# Patient Record
Sex: Male | Born: 1970
Health system: Southern US, Community
[De-identification: ages and names within clinical notes are randomized; demographics above are authoritative.]

## PROBLEM LIST (undated history)

## (undated) DIAGNOSIS — T7840XA Allergy, unspecified, initial encounter: Secondary | ICD-10-CM

## (undated) DIAGNOSIS — J45909 Unspecified asthma, uncomplicated: Secondary | ICD-10-CM

## (undated) HISTORY — DX: Allergy, unspecified, initial encounter: T78.40XA

## (undated) HISTORY — DX: Unspecified asthma, uncomplicated: J45.909

---

## 1997-06-18 ENCOUNTER — Emergency Department (HOSPITAL_COMMUNITY): Admission: EM | Admit: 1997-06-18 | Discharge: 1997-06-18 | Payer: Self-pay | Admitting: Emergency Medicine

## 1998-05-21 ENCOUNTER — Emergency Department (HOSPITAL_COMMUNITY): Admission: EM | Admit: 1998-05-21 | Discharge: 1998-05-21 | Payer: Self-pay | Admitting: Emergency Medicine

## 1999-11-02 ENCOUNTER — Emergency Department (HOSPITAL_COMMUNITY): Admission: EM | Admit: 1999-11-02 | Discharge: 1999-11-02 | Payer: Self-pay | Admitting: *Deleted

## 2004-02-26 ENCOUNTER — Emergency Department (HOSPITAL_COMMUNITY): Admission: EM | Admit: 2004-02-26 | Discharge: 2004-02-27 | Payer: Self-pay | Admitting: Internal Medicine

## 2004-06-03 ENCOUNTER — Emergency Department (HOSPITAL_COMMUNITY): Admission: EM | Admit: 2004-06-03 | Discharge: 2004-06-03 | Payer: Self-pay | Admitting: Emergency Medicine

## 2005-03-23 ENCOUNTER — Emergency Department (HOSPITAL_COMMUNITY): Admission: EM | Admit: 2005-03-23 | Discharge: 2005-03-24 | Payer: Self-pay | Admitting: Emergency Medicine

## 2008-08-31 ENCOUNTER — Emergency Department (HOSPITAL_COMMUNITY): Admission: EM | Admit: 2008-08-31 | Discharge: 2008-08-31 | Payer: Self-pay | Admitting: Emergency Medicine

## 2013-06-19 ENCOUNTER — Ambulatory Visit (INDEPENDENT_AMBULATORY_CARE_PROVIDER_SITE_OTHER): Payer: No Typology Code available for payment source | Admitting: Family Medicine

## 2013-06-19 VITALS — BP 122/68 | HR 67 | Temp 98.6°F | Resp 16 | Ht 67.5 in | Wt 175.4 lb

## 2013-06-19 DIAGNOSIS — J309 Allergic rhinitis, unspecified: Secondary | ICD-10-CM

## 2013-06-19 DIAGNOSIS — J45909 Unspecified asthma, uncomplicated: Secondary | ICD-10-CM | POA: Insufficient documentation

## 2013-06-19 MED ORDER — ALBUTEROL SULFATE HFA 108 (90 BASE) MCG/ACT IN AERS: 1.0000 | INHALATION_SPRAY | RESPIRATORY_TRACT | Status: DC | PRN

## 2013-06-19 MED ORDER — FLUTICASONE PROPIONATE 50 MCG/ACT NA SUSP: 2.0000 | Freq: Every day | NASAL | Status: AC

## 2013-06-19 MED ORDER — BECLOMETHASONE DIPROPIONATE 40 MCG/ACT IN AERS: 1.0000 | INHALATION_SPRAY | Freq: Two times a day (BID) | RESPIRATORY_TRACT | Status: DC

## 2013-06-19 NOTE — Patient Instructions (Signed)
Start Qvar, albuterol if needed, and if still having frequent symptoms on this regimen - return to look at other asthma treatments.   Continue allegra each day, start flonase nasal spray during allergy season.   Asthma, Adult Asthma is a recurring condition in which the airways tighten and narrow. Asthma can make it difficult to breathe. It can cause coughing, wheezing, and shortness of breath. Asthma episodes (also called asthma attacks) range from minor to life-threatening. Asthma cannot be cured, but medicines and lifestyle changes can help control it. CAUSES Asthma is believed to be caused by inherited (genetic) and environmental factors, but its exact cause is unknown. Asthma may be triggered by allergens, lung infections, or irritants in the air. Asthma triggers are different for each person. Common triggers include:   Animal dander.  Dust mites.  Cockroaches.  Pollen from trees or grass.  Mold.  Smoke.  Air pollutants such as dust, household cleaners, hair sprays, aerosol sprays, paint fumes, strong chemicals, or strong odors.  Cold air, weather changes, and winds (which increase molds and pollens in the air).  Strong emotional expressions such as crying or laughing hard.  Stress.  Certain medicines (such as aspirin) or types of drugs (such as beta-blockers).  Sulfites in foods and drinks. Foods and drinks that may contain sulfites include dried fruit, potato chips, and sparkling grape juice.  Infections or inflammatory conditions such as the flu, a cold, or an inflammation of the nasal membranes (rhinitis).  Gastroesophageal reflux disease (GERD).  Exercise or strenuous activity. SYMPTOMS Symptoms may occur immediately after asthma is triggered or many hours later. Symptoms include:  Wheezing.  Excessive nighttime or early morning coughing.  Frequent or severe coughing with a common cold.  Chest tightness.  Shortness of breath. DIAGNOSIS  The diagnosis of  asthma is made by a review of your medical history and a physical exam. Tests may also be performed. These may include:  Lung function studies. These tests show how much air you breath in and out.  Allergy tests.  Imaging tests such as X-rays. TREATMENT  Asthma cannot be cured, but it can usually be controlled. Treatment involves identifying and avoiding your asthma triggers. It also involves medicines. There are 2 classes of medicine used for asthma treatment:   Controller medicines. These prevent asthma symptoms from occurring. They are usually taken every day.  Reliever or rescue medicines. These quickly relieve asthma symptoms. They are used as needed and provide short-term relief. Your health care provider will help you create an asthma action plan. An asthma action plan is a written plan for managing and treating your asthma attacks. It includes a list of your asthma triggers and how they may be avoided. It also includes information on when medicines should be taken and when their dosage should be changed. An action plan may also involve the use of a device called a peak flow meter. A peak flow meter measures how well the lungs are working. It helps you monitor your condition. HOME CARE INSTRUCTIONS   Take medicine as directed by your health care provider. Speak with your health care provider if you have questions about how or when to take the medicines.  Use a peak flow meter as directed by your health care provider. Record and keep track of readings.  Understand and use the action plan to help minimize or stop an asthma attack without needing to seek medical care.  Control your home environment in the following ways to help prevent asthma attacks:  Do not smoke. Avoid being exposed to secondhand smoke.  Change your heating and air conditioning filter regularly.  Limit your use of fireplaces and wood stoves.  Get rid of pests (such as roaches and mice) and their  droppings.  Throw away plants if you see mold on them.  Clean your floors and dust regularly. Use unscented cleaning products.  Try to have someone else vacuum for you regularly. Stay out of rooms while they are being vacuumed and for a short while afterward. If you vacuum, use a dust mask from a hardware store, a double-layered or microfilter vacuum cleaner bag, or a vacuum cleaner with a HEPA filter.  Replace carpet with wood, tile, or vinyl flooring. Carpet can trap dander and dust.  Use allergy-proof pillows, mattress covers, and box spring covers.  Wash bed sheets and blankets every week in hot water and dry them in a dryer.  Use blankets that are made of polyester or cotton.  Clean bathrooms and kitchens with bleach. If possible, have someone repaint the walls in these rooms with mold-resistant paint. Keep out of the rooms that are being cleaned and painted.  Wash hands frequently. SEEK MEDICAL CARE IF:   You have wheezing, shortness of breath, or a cough even if taking medicine to prevent attacks.  The colored mucus you cough up (sputum) is thicker than usual.  Your sputum changes from clear or white to yellow, green, gray, or bloody.  You have any problems that may be related to the medicines you are taking (such as a rash, itching, swelling, or trouble breathing).  You are using a reliever medicine more than 2 3 times per week.  Your peak flow is still at 50 79% of you personal best after following your action plan for 1 hour. SEEK IMMEDIATE MEDICAL CARE IF:   You seem to be getting worse and are unresponsive to treatment during an asthma attack.  You are short of breath even at rest.  You get short of breath when doing very little physical activity.  You have difficulty eating, drinking, or talking due to asthma symptoms.  You develop chest pain.  You develop a fast heartbeat.  You have a bluish color to your lips or fingernails.  You are lightheaded, dizzy,  or faint.  Your peak flow is less than 50% of your personal best.  You have a fever or persistent symptoms for more than 2 3 days.  You have a fever and symptoms suddenly get worse. MAKE SURE YOU:   Understand these instructions.  Will watch your condition.  Will get help right away if you are not doing well or get worse. Document Released: 02/01/2005 Document Revised: 10/04/2012 Document Reviewed: 08/31/2012 [facility] Patient Information 2014 [place], [place].    Allergic Rhinitis Allergic rhinitis is when the mucous membranes in the nose respond to allergens. Allergens are particles in the air that cause your body to have an allergic reaction. This causes you to release allergic antibodies. Through a chain of events, these eventually cause you to release histamine into the blood stream. Although meant to protect the body, it is this release of histamine that causes your discomfort, such as frequent sneezing, congestion, and an itchy, runny nose.  CAUSES  Seasonal allergic rhinitis (hay fever) is caused by pollen allergens that may come from grasses, trees, and weeds. Year-round allergic rhinitis (perennial allergic rhinitis) is caused by allergens such as house dust mites, pet dander, and mold spores.  SYMPTOMS   Nasal stuffiness (  congestion).  Itchy, runny nose with sneezing and tearing of the eyes. DIAGNOSIS  Your health care provider can help you determine the allergen or allergens that trigger your symptoms. If you and your health care provider are unable to determine the allergen, skin or blood testing may be used. TREATMENT  Allergic Rhinitis does not have a cure, but it can be controlled by:  Medicines and allergy shots (immunotherapy).  Avoiding the allergen. Hay fever may often be treated with antihistamines in pill or nasal spray forms. Antihistamines block the effects of histamine. There are over-the-counter medicines that may help with nasal congestion and swelling  around the eyes. Check with your health care provider before taking or giving this medicine.  If avoiding the allergen or the medicine prescribed do not work, there are many new medicines your health care provider can prescribe. Stronger medicine may be used if initial measures are ineffective. Desensitizing injections can be used if medicine and avoidance does not work. Desensitization is when a patient is given ongoing shots until the body becomes less sensitive to the allergen. Make sure you follow up with your health care provider if problems continue. HOME CARE INSTRUCTIONS It is not possible to completely avoid allergens, but you can reduce your symptoms by taking steps to limit your exposure to them. It helps to know exactly what you are allergic to so that you can avoid your specific triggers. SEEK MEDICAL CARE IF:   You have a fever.  You develop a cough that does not stop easily (persistent).  You have shortness of breath.  You start wheezing.  Symptoms interfere with normal daily activities. Document Released: 10/27/2000 Document Revised: 11/22/2012 Document Reviewed: 10/09/2012 [facility] Patient Information 2014 [place], [place].

## 2013-06-19 NOTE — Progress Notes (Signed)
Subjective:    Patient ID: Omar Lawson, male    DOB: 06-29-1970, 43 y.o.   MRN: [id number] This chart was scribed for Meredith Staggers, MD by Valera Castle, ED Scribe. This patient was seen in room 01 and the patient's care was started at 3:05 PM.  Chief Complaint  Patient presents with  . Medication Refill   HPI Omar Lawson is a 43 y.o. male who presents to the [facility] for a refill of his Albuterol inhaler for environmental allergies and asthma flare up. His symptoms include chest tightness and wheezing. He states he has been using his inhaler about every other day for the past month, and over the last few days he has had to use his inhaler every day. He reports having trouble sleeping once a week due to his symptoms. He reports also trying Allegra-D, with little relief. He states the last time he was seen for similar symptoms was last year. He reports his asthma typically is exacerbated during the Fall. He denies fever, chest pain, and any other associated symptoms. 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 He is excited about his and his wife's upcoming baby!   PCP - No PCP Per Patient  There are no active problems to display for this patient.  Past Medical History  Diagnosis Date  . Allergy   . Asthma    History reviewed. No pertinent past surgical history. No Known Allergies Prior to Admission medications   Medication Sig Start Date End Date Taking? Authorizing Provider  albuterol (PROVENTIL HFA;VENTOLIN HFA) 108 (90 BASE) MCG/ACT inhaler Inhale into the lungs every 6 (six) hours as needed for wheezing or shortness of breath.   Yes Historical Provider, MD  fexofenadine-pseudoephedrine (ALLEGRA-D 24) 180-240 MG per 24 hr tablet Take 1 tablet by mouth daily.   Yes Historical Provider, MD   Review of Systems  Constitutional: Negative for fever.  Respiratory: Positive for chest tightness and wheezing.   Cardiovascular: Negative for chest pain.    Allergic/Immunologic: Positive for environmental allergies.      Objective:   Physical Exam  Nursing note and vitals reviewed. Constitutional: He is oriented to person, place, and time. He appears well-developed and well-nourished. No distress.  HENT:  Head: Normocephalic and atraumatic.  Right Ear: Hearing, tympanic membrane, external ear and ear canal normal.  Left Ear: Hearing, tympanic membrane, external ear and ear canal normal.  Nose: Nose normal. No rhinorrhea.  Mouth/Throat: Uvula is midline, oropharynx is clear and moist and mucous membranes are normal. No oropharyngeal exudate or posterior oropharyngeal erythema.  Eyes: Conjunctivae and EOM are normal. Pupils are equal, round, and reactive to light.  Neck: Normal range of motion. Neck supple. No thyromegaly present.  Cardiovascular: Normal rate, regular rhythm, normal heart sounds and intact distal pulses.   No murmur heard. Pulmonary/Chest: Effort normal and breath sounds normal. No respiratory distress. He has no wheezes. He has no rhonchi. He has no rales.  Musculoskeletal: Normal range of motion.  Lymphadenopathy:    He has no cervical adenopathy.  Neurological: He is alert and oriented to person, place, and time.  Skin: Skin is warm and dry.  Psychiatric: He has a normal mood and affect. His behavior is normal.  BP 122/68  Pulse 67  Temp(Src) 98.6 F (37 C) (Oral)  Resp 16  Ht 5' 7.5" (1.715 m)  Wt 175 lb 6.4 oz (79.561 kg)  BMI 27.05 kg/m2     Assessment & Plan:   Omar Deer  Lawson is a 43 y.o. male Extrinsic asthma, unspecified - Plan: beclomethasone (QVAR) 40 MCG/ACT inhaler, albuterol (PROVENTIL HFA;VENTOLIN HFA) 108 (90 BASE) MCG/ACT inhaler  Allergic rhinitis - Plan: fluticasone (FLONASE) 50 MCG/ACT nasal spray  Allergic rhinitis, with flair of prior mild intermittent asthma. Will start Qvar for improved control. Cont albuterol prn.  Also discussed flonase NS, in addition to antihistamine. Can decrease  to just albuterol prn when sx's improve after allergy season.  rtc precautions.  Meds ordered this encounter  Medications  . fexofenadine-pseudoephedrine (ALLEGRA-D 24) 180-240 MG per 24 hr tablet    Sig: Take 1 tablet by mouth daily.  Marland Kitchen DISCONTD: albuterol (PROVENTIL HFA;VENTOLIN HFA) 108 (90 BASE) MCG/ACT inhaler    Sig: Inhale into the lungs every 6 (six) hours as needed for wheezing or shortness of breath.  . beclomethasone (QVAR) 40 MCG/ACT inhaler    Sig: Inhale 1 puff into the lungs 2 (two) times daily.    Dispense:  1 Inhaler    Refill:  3  . albuterol (PROVENTIL HFA;VENTOLIN HFA) 108 (90 BASE) MCG/ACT inhaler    Sig: Inhale 1-2 puffs into the lungs every 4 (four) hours as needed for wheezing or shortness of breath.    Dispense:  1 Inhaler    Refill:  0  . fluticasone (FLONASE) 50 MCG/ACT nasal spray    Sig: Place 2 sprays into both nostrils daily.    Dispense:  16 g    Refill:  6   Patient Instructions  Start Qvar, albuterol if needed, and if still having frequent symptoms on this regimen - return to look at other asthma treatments.   Continue allegra each day, start flonase nasal spray during allergy season.   Asthma, Adult Asthma is a recurring condition in which the airways tighten and narrow. Asthma can make it difficult to breathe. It can cause coughing, wheezing, and shortness of breath. Asthma episodes (also called asthma attacks) range from minor to life-threatening. Asthma cannot be cured, but medicines and lifestyle changes can help control it. CAUSES Asthma is believed to be caused by inherited (genetic) and environmental factors, but its exact cause is unknown. Asthma may be triggered by allergens, lung infections, or irritants in the air. Asthma triggers are different for each person. Common triggers include:   Animal dander.  Dust mites.  Cockroaches.  Pollen from trees or grass.  Mold.  Smoke.  Air pollutants such as dust, household cleaners, hair  sprays, aerosol sprays, paint fumes, strong chemicals, or strong odors.  Cold air, weather changes, and winds (which increase molds and pollens in the air).  Strong emotional expressions such as crying or laughing hard.  Stress.  Certain medicines (such as aspirin) or types of drugs (such as beta-blockers).  Sulfites in foods and drinks. Foods and drinks that may contain sulfites include dried fruit, potato chips, and sparkling grape juice.  Infections or inflammatory conditions such as the flu, a cold, or an inflammation of the nasal membranes (rhinitis).  Gastroesophageal reflux disease (GERD).  Exercise or strenuous activity. SYMPTOMS Symptoms may occur immediately after asthma is triggered or many hours later. Symptoms include:  Wheezing.  Excessive nighttime or early morning coughing.  Frequent or severe coughing with a common cold.  Chest tightness.  Shortness of breath. DIAGNOSIS  The diagnosis of asthma is made by a review of your medical history and a physical exam. Tests may also be performed. These may include:  Lung function studies. These tests show how much air you breath in  and out.  Allergy tests.  Imaging tests such as X-rays. TREATMENT  Asthma cannot be cured, but it can usually be controlled. Treatment involves identifying and avoiding your asthma triggers. It also involves medicines. There are 2 classes of medicine used for asthma treatment:   Controller medicines. These prevent asthma symptoms from occurring. They are usually taken every day.  Reliever or rescue medicines. These quickly relieve asthma symptoms. They are used as needed and provide short-term relief. Your health care provider will help you create an asthma action plan. An asthma action plan is a written plan for managing and treating your asthma attacks. It includes a list of your asthma triggers and how they may be avoided. It also includes information on when medicines should be taken  and when their dosage should be changed. An action plan may also involve the use of a device called a peak flow meter. A peak flow meter measures how well the lungs are working. It helps you monitor your condition. HOME CARE INSTRUCTIONS   Take medicine as directed by your health care provider. Speak with your health care provider if you have questions about how or when to take the medicines.  Use a peak flow meter as directed by your health care provider. Record and keep track of readings.  Understand and use the action plan to help minimize or stop an asthma attack without needing to seek medical care.  Control your home environment in the following ways to help prevent asthma attacks:  Do not smoke. Avoid being exposed to secondhand smoke.  Change your heating and air conditioning filter regularly.  Limit your use of fireplaces and wood stoves.  Get rid of pests (such as roaches and mice) and their droppings.  Throw away plants if you see mold on them.  Clean your floors and dust regularly. Use unscented cleaning products.  Try to have someone else vacuum for you regularly. Stay out of rooms while they are being vacuumed and for a short while afterward. If you vacuum, use a dust mask from a hardware store, a double-layered or microfilter vacuum cleaner bag, or a vacuum cleaner with a HEPA filter.  Replace carpet with wood, tile, or vinyl flooring. Carpet can trap dander and dust.  Use allergy-proof pillows, mattress covers, and box spring covers.  Wash bed sheets and blankets every week in hot water and dry them in a dryer.  Use blankets that are made of polyester or cotton.  Clean bathrooms and kitchens with bleach. If possible, have someone repaint the walls in these rooms with mold-resistant paint. Keep out of the rooms that are being cleaned and painted.  Wash hands frequently. SEEK MEDICAL CARE IF:   You have wheezing, shortness of breath, or a cough even if taking  medicine to prevent attacks.  The colored mucus you cough up (sputum) is thicker than usual.  Your sputum changes from clear or white to yellow, green, gray, or bloody.  You have any problems that may be related to the medicines you are taking (such as a rash, itching, swelling, or trouble breathing).  You are using a reliever medicine more than 2 3 times per week.  Your peak flow is still at 50 79% of you personal best after following your action plan for 1 hour. SEEK IMMEDIATE MEDICAL CARE IF:   You seem to be getting worse and are unresponsive to treatment during an asthma attack.  You are short of breath even at rest.  You get  short of breath when doing very little physical activity.  You have difficulty eating, drinking, or talking due to asthma symptoms.  You develop chest pain.  You develop a fast heartbeat.  You have a bluish color to your lips or fingernails.  You are lightheaded, dizzy, or faint.  Your peak flow is less than 50% of your personal best.  You have a fever or persistent symptoms for more than 2 3 days.  You have a fever and symptoms suddenly get worse. MAKE SURE YOU:   Understand these instructions.  Will watch your condition.  Will get help right away if you are not doing well or get worse. Document Released: 02/01/2005 Document Revised: 10/04/2012 Document Reviewed: 08/31/2012 [facility] Patient Information 2014 [place], [place].    Allergic Rhinitis Allergic rhinitis is when the mucous membranes in the nose respond to allergens. Allergens are particles in the air that cause your body to have an allergic reaction. This causes you to release allergic antibodies. Through a chain of events, these eventually cause you to release histamine into the blood stream. Although meant to protect the body, it is this release of histamine that causes your discomfort, such as frequent sneezing, congestion, and an itchy, runny nose.  CAUSES  Seasonal allergic  rhinitis (hay fever) is caused by pollen allergens that may come from grasses, trees, and weeds. Year-round allergic rhinitis (perennial allergic rhinitis) is caused by allergens such as house dust mites, pet dander, and mold spores.  SYMPTOMS   Nasal stuffiness (congestion).  Itchy, runny nose with sneezing and tearing of the eyes. DIAGNOSIS  Your health care provider can help you determine the allergen or allergens that trigger your symptoms. If you and your health care provider are unable to determine the allergen, skin or blood testing may be used. TREATMENT  Allergic Rhinitis does not have a cure, but it can be controlled by:  Medicines and allergy shots (immunotherapy).  Avoiding the allergen. Hay fever may often be treated with antihistamines in pill or nasal spray forms. Antihistamines block the effects of histamine. There are over-the-counter medicines that may help with nasal congestion and swelling around the eyes. Check with your health care provider before taking or giving this medicine.  If avoiding the allergen or the medicine prescribed do not work, there are many new medicines your health care provider can prescribe. Stronger medicine may be used if initial measures are ineffective. Desensitizing injections can be used if medicine and avoidance does not work. Desensitization is when a patient is given ongoing shots until the body becomes less sensitive to the allergen. Make sure you follow up with your health care provider if problems continue. HOME CARE INSTRUCTIONS It is not possible to completely avoid allergens, but you can reduce your symptoms by taking steps to limit your exposure to them. It helps to know exactly what you are allergic to so that you can avoid your specific triggers. SEEK MEDICAL CARE IF:   You have a fever.  You develop a cough that does not stop easily (persistent).  You have shortness of breath.  You start wheezing.  Symptoms interfere with  normal daily activities. Document Released: 10/27/2000 Document Revised: 11/22/2012 Document Reviewed: 10/09/2012 [facility] Patient Information 2014 [place], [place].     I personally performed the services described in this documentation, which was scribed in my presence. The recorded information has been reviewed and considered, and addended by me as needed.

## 2013-09-11 ENCOUNTER — Ambulatory Visit (INDEPENDENT_AMBULATORY_CARE_PROVIDER_SITE_OTHER): Payer: No Typology Code available for payment source | Admitting: Emergency Medicine

## 2013-09-11 VITALS — BP 128/88 | HR 67 | Temp 97.8°F | Resp 16 | Ht 68.75 in | Wt 171.4 lb

## 2013-09-11 DIAGNOSIS — M5412 Radiculopathy, cervical region: Secondary | ICD-10-CM

## 2013-09-11 MED ORDER — CYCLOBENZAPRINE HCL 10 MG PO TABS: 10.0000 mg | ORAL_TABLET | Freq: Three times a day (TID) | ORAL | Status: AC | PRN

## 2013-09-11 MED ORDER — OXYCODONE-ACETAMINOPHEN 5-325 MG PO TABS: 1.0000 | ORAL_TABLET | ORAL | Status: AC | PRN

## 2013-09-11 MED ORDER — NAPROXEN SODIUM 550 MG PO TABS: 550.0000 mg | ORAL_TABLET | Freq: Two times a day (BID) | ORAL | Status: AC

## 2013-09-11 NOTE — Patient Instructions (Signed)

## 2013-09-11 NOTE — Progress Notes (Signed)
[facility] [address], [place] [id number] [phone]  Date:  09/11/2013   Name:  Omar Lawson   DOB:  [birth year]   MRN:  [id number]  PCP:  No PCP Per Patient    Chief Complaint: Neck Pain and Shoulder Pain   History of Present Illness:  Omar RumpfChristopher Jenne is a 43 y.o. very pleasant male patient who presents with the following:  Patient has pain in the right neck and shoulder for the past three weeks.  [place]reated in [place] with robaxin, a NSAID, and vicodin.  Says no improvement. No history of injury or overuse.  Says his right arm (right handed) is not numb but he is uncertain of the strength in his arm.  Says his left index finger is numb.  No pain or weakness in the left neck or shoulder.  No improvement with over the counter medications or other home remedies. Denies other complaint or health concern today.   Patient Active Problem List   Diagnosis Date Noted  . Extrinsic asthma, unspecified 06/19/2013  . Allergic rhinitis 06/19/2013    Past Medical History  Diagnosis Date  . Allergy   . Asthma     History reviewed. No pertinent past surgical history.  History  Substance Use Topics  . Smoking status: Never Smoker   . Smokeless tobacco: Not on file  . Alcohol Use: Yes    Family History  Problem Relation Age of Onset  . Stroke Mother     No Known Allergies  Medication list has been reviewed and updated.  Current Outpatient Prescriptions on File Prior to Visit  Medication Sig Dispense Refill  . albuterol (PROVENTIL HFA;VENTOLIN HFA) 108 (90 BASE) MCG/ACT inhaler Inhale 1-2 puffs into the lungs every 4 (four) hours as needed for wheezing or shortness of breath.  1 Inhaler  0  . beclomethasone (QVAR) 40 MCG/ACT inhaler Inhale 1 puff into the lungs 2 (two) times daily.  1 Inhaler  3  . fluticasone (FLONASE) 50 MCG/ACT nasal spray Place 2 sprays into both nostrils daily.  16 g  6  . fexofenadine-pseudoephedrine (ALLEGRA-D 24) 180-240 MG  per 24 hr tablet Take 1 tablet by mouth daily.       No current facility-administered medications on file prior to visit.    Review of Systems:  As per HPI, otherwise negative.    Physical Examination: Filed Vitals:   09/11/13 1545  BP: 128/88  Pulse: 67  Temp: 97.8 F (36.6 C)  Resp: 16   Filed Vitals:   09/11/13 1545  Height: 5' 8.75" (1.746 m)  Weight: 171 lb 6.4 oz (77.747 kg)   Body mass index is 25.5 kg/(m^2). Ideal Body Weight: Weight in (lb) to have BMI = 25: 167.7   GEN: WDWN, NAD, Non-toxic, Alert & Oriented x 3 HEENT: Atraumatic, Normocephalic.  Ears and Nose: No external deformity. EXTR: No clubbing/cyanosis/edema NEURO: Normal gait.  PSYCH: Normally interactive. Conversant. Not depressed or anxious appearing.  Calm demeanor.  Tender and spasm in right trapezius.  Minimal right arm weak flexion and extension of elbow  Assessment and Plan: Cervical neuritis MRI Anaprox Flexeril Percocet   Signed,  Phillips OdorJeffery Imanuel Pruiett, MD

## 2013-09-17 ENCOUNTER — Inpatient Hospital Stay: Admission: RE | Admit: 2013-09-17 | Payer: Self-pay | Source: Ambulatory Visit

## 2013-09-20 ENCOUNTER — Ambulatory Visit
Admission: RE | Admit: 2013-09-20 | Discharge: 2013-09-20 | Disposition: A | Discharge status: Home / Self Care | Payer: No Typology Code available for payment source | Source: Ambulatory Visit | Attending: Emergency Medicine | Admitting: Emergency Medicine

## 2013-09-20 DIAGNOSIS — M5412 Radiculopathy, cervical region: Secondary | ICD-10-CM

## 2013-09-21 ENCOUNTER — Other Ambulatory Visit: Payer: Self-pay | Admitting: Emergency Medicine

## 2013-09-21 DIAGNOSIS — M5412 Radiculopathy, cervical region: Secondary | ICD-10-CM

## 2013-09-24 ENCOUNTER — Telehealth: Payer: Self-pay

## 2013-09-24 NOTE — Telephone Encounter (Signed)
Please review. Thanks!  

## 2013-09-24 NOTE — Telephone Encounter (Signed)
PT STATES HE HAD A MRI DONE AND WAS TOLD HE WOULD GET A CALL BACK IN 48 HRS. STILL HASN'T HEARD FROM ANYONE PLEASE CALL [phone]

## 2013-09-27 ENCOUNTER — Other Ambulatory Visit: Payer: Self-pay | Admitting: Emergency Medicine

## 2013-09-27 DIAGNOSIS — M5412 Radiculopathy, cervical region: Secondary | ICD-10-CM

## 2013-09-27 NOTE — Telephone Encounter (Signed)
I spoke to Dr Dareen PianoAnderson about results which he had not seen previously. He is referring pt to neurosurgeon d/t some abnormalities on MRI. LMOM for pt that this referral is being made and that I will be happy to review the abnormalities in more detail if he would like to call back and ask for me.

## 2013-09-28 NOTE — Telephone Encounter (Signed)
Pt called back and I d/w him MRI results and answered questions about abnormalities. Advised that the neurosurgeon will be much more able to discuss in detail and advise concerning treatment. Pt thanked me and agreed to referral.

## 2014-03-31 ENCOUNTER — Ambulatory Visit (INDEPENDENT_AMBULATORY_CARE_PROVIDER_SITE_OTHER): Payer: Managed Care, Other (non HMO) | Admitting: Internal Medicine

## 2014-03-31 ENCOUNTER — Emergency Department (HOSPITAL_COMMUNITY)
Admission: EM | Admit: 2014-03-31 | Discharge: 2014-03-31 | Disposition: A | Discharge status: Home / Self Care | Payer: 59 | Attending: Emergency Medicine | Admitting: Emergency Medicine

## 2014-03-31 ENCOUNTER — Emergency Department (HOSPITAL_COMMUNITY): Payer: 59

## 2014-03-31 ENCOUNTER — Other Ambulatory Visit (HOSPITAL_COMMUNITY): Payer: No Typology Code available for payment source

## 2014-03-31 ENCOUNTER — Ambulatory Visit (INDEPENDENT_AMBULATORY_CARE_PROVIDER_SITE_OTHER): Payer: Managed Care, Other (non HMO)

## 2014-03-31 ENCOUNTER — Encounter (HOSPITAL_COMMUNITY): Payer: Self-pay | Admitting: Emergency Medicine

## 2014-03-31 VITALS — BP 116/82 | HR 59 | Temp 98.4°F | Resp 19 | Ht 68.5 in | Wt 153.8 lb

## 2014-03-31 DIAGNOSIS — R110 Nausea: Secondary | ICD-10-CM

## 2014-03-31 DIAGNOSIS — R1032 Left lower quadrant pain: Secondary | ICD-10-CM

## 2014-03-31 DIAGNOSIS — Z79899 Other long term (current) drug therapy: Secondary | ICD-10-CM | POA: Insufficient documentation

## 2014-03-31 DIAGNOSIS — Z7951 Long term (current) use of inhaled steroids: Secondary | ICD-10-CM | POA: Insufficient documentation

## 2014-03-31 DIAGNOSIS — J4530 Mild persistent asthma, uncomplicated: Secondary | ICD-10-CM

## 2014-03-31 DIAGNOSIS — K5732 Diverticulitis of large intestine without perforation or abscess without bleeding: Secondary | ICD-10-CM

## 2014-03-31 DIAGNOSIS — R062 Wheezing: Secondary | ICD-10-CM

## 2014-03-31 DIAGNOSIS — J45909 Unspecified asthma, uncomplicated: Secondary | ICD-10-CM | POA: Diagnosis not present

## 2014-03-31 DIAGNOSIS — R059 Cough, unspecified: Secondary | ICD-10-CM

## 2014-03-31 DIAGNOSIS — J45902 Unspecified asthma with status asthmaticus: Secondary | ICD-10-CM

## 2014-03-31 DIAGNOSIS — R05 Cough: Secondary | ICD-10-CM

## 2014-03-31 LAB — LIPASE: Lipase: 10 U/L (ref 0–75)

## 2014-03-31 LAB — BASIC METABOLIC PANEL
ANION GAP: 7 (ref 5–15)
BUN: 13 mg/dL (ref 6–23)
CALCIUM: 9.1 mg/dL (ref 8.4–10.5)
CHLORIDE: 102 mmol/L (ref 96–112)
CO2: 28 mmol/L (ref 19–32)
CREATININE: 1.09 mg/dL (ref 0.50–1.35)
GFR calc Af Amer: >90 mL/min (ref >90)
GFR calc non Af Amer: 82 mL/min — ABNORMAL LOW (ref >90)
Glucose, Bld: 94 mg/dL (ref 70–99)
Potassium: 3.5 mmol/L (ref 3.5–5.1)
Sodium: 137 mmol/L (ref 135–145)

## 2014-03-31 LAB — POCT CBC
Granulocyte percent: 66.6 %G (ref 37–80)
HEMATOCRIT: 46.4 % (ref 43.5–53.7)
Hemoglobin: 15 g/dL (ref 14.1–18.1)
Lymph, poc: 3.7 — AB (ref 0.6–3.4)
MCH: 28.6 pg (ref 27–31.2)
MCHC: 32.3 g/dL (ref 31.8–35.4)
MCV: 88.5 fL (ref 80–97)
MID (cbc): 0.9 (ref 0–0.9)
MPV: 7.8 fL (ref 0–99.8)
POC Granulocyte: 9.1 — AB (ref 2–6.9)
POC LYMPH %: 27 % (ref 10–50)
POC MID %: 6.4 % (ref 0–12)
Platelet Count, POC: 218 10*3/uL (ref 142–424)
RBC: 5.24 M/uL (ref 4.69–6.13)
RDW, POC: 14.1 %
WBC: 13.6 10*3/uL — AB (ref 4.6–10.2)

## 2014-03-31 LAB — POCT URINALYSIS DIPSTICK
BILIRUBIN UA: NEGATIVE
Blood, UA: NEGATIVE
Glucose, UA: NEGATIVE
Ketones, UA: 15
LEUKOCYTES UA: NEGATIVE
NITRITE UA: NEGATIVE
Protein, UA: NEGATIVE
Spec Grav, UA: 1.02
Urobilinogen, UA: 0.2
pH, UA: 6

## 2014-03-31 LAB — COMPLETE METABOLIC PANEL WITH GFR
ALT: 25 U/L (ref 0–53)
AST: 24 U/L (ref 0–37)
Albumin: 4.2 g/dL (ref 3.5–5.2)
Alkaline Phosphatase: 55 U/L (ref 39–117)
BILIRUBIN TOTAL: 1 mg/dL (ref 0.2–1.2)
BUN: 12 mg/dL (ref 6–23)
CALCIUM: 9.9 mg/dL (ref 8.4–10.5)
CHLORIDE: 100 meq/L (ref 96–112)
CO2: 29 meq/L (ref 19–32)
Creat: 1.18 mg/dL (ref 0.50–1.35)
GFR, EST AFRICAN AMERICAN: 87 mL/min
GFR, Est Non African American: 75 mL/min
Glucose, Bld: 87 mg/dL (ref 70–99)
POTASSIUM: 4.6 meq/L (ref 3.5–5.3)
Sodium: 138 mEq/L (ref 135–145)
Total Protein: 7.8 g/dL (ref 6.0–8.3)

## 2014-03-31 LAB — POCT UA - MICROSCOPIC ONLY
CASTS, UR, LPF, POC: NEGATIVE
Crystals, Ur, HPF, POC: NEGATIVE
MUCUS UA: NEGATIVE
YEAST UA: NEGATIVE

## 2014-03-31 MED ORDER — METRONIDAZOLE 500 MG PO TABS: 500.0000 mg | ORAL_TABLET | Freq: Two times a day (BID) | ORAL | Status: AC

## 2014-03-31 MED ORDER — HYDROCODONE-ACETAMINOPHEN 5-325 MG PO TABS: 1.0000 | ORAL_TABLET | ORAL | Status: AC | PRN

## 2014-03-31 MED ORDER — MORPHINE SULFATE 4 MG/ML IJ SOLN
4.0000 mg | INTRAMUSCULAR | Status: DC | PRN
  Administered 2014-03-31: 4 mg via INTRAVENOUS
  Filled 2014-03-31: qty 1

## 2014-03-31 MED ORDER — ALBUTEROL SULFATE HFA 108 (90 BASE) MCG/ACT IN AERS: 1.0000 | INHALATION_SPRAY | RESPIRATORY_TRACT | Status: DC | PRN

## 2014-03-31 MED ORDER — METRONIDAZOLE 500 MG PO TABS
500.0000 mg | ORAL_TABLET | Freq: Once | ORAL | Status: AC
  Administered 2014-03-31: 500 mg via ORAL
  Filled 2014-03-31: qty 1

## 2014-03-31 MED ORDER — IOHEXOL 300 MG/ML  SOLN
100.0000 mL | Freq: Once | INTRAMUSCULAR | Status: AC | PRN
  Administered 2014-03-31: 100 mL via INTRAVENOUS

## 2014-03-31 MED ORDER — BECLOMETHASONE DIPROPIONATE 40 MCG/ACT IN AERS: 1.0000 | INHALATION_SPRAY | Freq: Two times a day (BID) | RESPIRATORY_TRACT | Status: DC

## 2014-03-31 MED ORDER — IPRATROPIUM BROMIDE 0.02 % IN SOLN
0.5000 mg | Freq: Once | RESPIRATORY_TRACT | Status: AC
  Administered 2014-03-31: 0.5 mg via RESPIRATORY_TRACT

## 2014-03-31 MED ORDER — IOHEXOL 300 MG/ML  SOLN
50.0000 mL | Freq: Once | INTRAMUSCULAR | Status: AC | PRN
  Administered 2014-03-31: 50 mL via ORAL

## 2014-03-31 MED ORDER — ONDANSETRON HCL 4 MG/2ML IJ SOLN
4.0000 mg | Freq: Once | INTRAMUSCULAR | Status: AC
  Administered 2014-03-31: 4 mg via INTRAVENOUS
  Filled 2014-03-31: qty 2

## 2014-03-31 MED ORDER — ONDANSETRON 4 MG PO TBDP: 4.0000 mg | ORAL_TABLET | Freq: Three times a day (TID) | ORAL | Status: AC | PRN

## 2014-03-31 MED ORDER — CIPROFLOXACIN HCL 500 MG PO TABS
500.0000 mg | ORAL_TABLET | Freq: Once | ORAL | Status: AC
  Administered 2014-03-31: 500 mg via ORAL
  Filled 2014-03-31: qty 1

## 2014-03-31 MED ORDER — CIPROFLOXACIN HCL 500 MG PO TABS: 500.0000 mg | ORAL_TABLET | Freq: Two times a day (BID) | ORAL | Status: AC

## 2014-03-31 MED ORDER — ALBUTEROL SULFATE (2.5 MG/3ML) 0.083% IN NEBU
2.5000 mg | INHALATION_SOLUTION | Freq: Once | RESPIRATORY_TRACT | Status: AC
  Administered 2014-03-31: 2.5 mg via RESPIRATORY_TRACT

## 2014-03-31 NOTE — ED Provider Notes (Signed)
CSN: [id number]     Arrival date & time 03/31/14  1538 History   First MD Initiated Contact with Patient 03/31/14 1553     Chief Complaint  Patient presents with  . Abdominal Pain     HPI  Patient presents for evaluation of abdominal pain. He was seen and evaluated urgent care and appropriate only sent here for evaluation or possible diverticulitis. He reports about 48 hours of her umbilical suprapubic abdominal pain. No fevers no chills. No nausea no vomiting. Normal bowel tones. No passage of blood. No history of prior similar episodes. Never been told have diverticuli or diverticulitis. No history of ulcerative colitis, Crohn's, inflammatory bowel disorder. Otherwise healthy male without abdominal surgeries.  Past Medical History  Diagnosis Date  . Allergy   . Asthma    History reviewed. No pertinent past surgical history. Family History  Problem Relation Age of Onset  . Stroke Mother    History  Substance Use Topics  . Smoking status: Never Smoker   . Smokeless tobacco: Never Used  . Alcohol Use: 0.0 oz/week    0 Standard drinks or equivalent per week    Review of Systems  Constitutional: Negative for fever, chills, diaphoresis, appetite change and fatigue.  HENT: Negative for mouth sores, sore throat and trouble swallowing.   Eyes: Negative for visual disturbance.  Respiratory: Negative for cough, chest tightness, shortness of breath and wheezing.   Cardiovascular: Negative for chest pain.  Gastrointestinal: Positive for nausea and abdominal pain. Negative for vomiting, diarrhea and abdominal distention.  Endocrine: Negative for polydipsia, polyphagia and polyuria.  Genitourinary: Negative for dysuria, frequency and hematuria.  Musculoskeletal: Negative for gait problem.  Skin: Negative for color change, pallor and rash.  Neurological: Negative for dizziness, syncope, light-headedness and headaches.  Hematological: Does not bruise/bleed easily.  Psychiatric/Behavioral:  Negative for behavioral problems and confusion.      Allergies  Review of patient's allergies indicates no known allergies.  Home Medications   Prior to Admission medications   Medication Sig Start Date End Date Taking? Authorizing Provider  albuterol (PROVENTIL HFA;VENTOLIN HFA) 108 (90 BASE) MCG/ACT inhaler Inhale 1-2 puffs into the lungs every 4 (four) hours as needed for wheezing or shortness of breath. 03/31/14  Yes Jonita Albee, MD  beclomethasone (QVAR) 40 MCG/ACT inhaler Inhale 1 puff into the lungs 2 (two) times daily. 03/31/14  Yes Jonita Albee, MD  fluticasone [facility]) 50 MCG/ACT nasal spray Place 2 sprays into both nostrils daily. 06/19/13  Yes Shade Flood, MD  Multiple Vitamin (MULTIVITAMIN WITH MINERALS) TABS tablet Take 1 tablet by mouth daily.   Yes Historical Provider, MD  ciprofloxacin (CIPRO) 500 MG tablet Take 1 tablet (500 mg total) by mouth every 12 (twelve) hours. 03/31/14   Rolland Porter, MD  cyclobenzaprine (FLEXERIL) 10 MG tablet Take 1 tablet (10 mg total) by mouth 3 (three) times daily as needed for muscle spasms. Patient not taking: Reported on 03/31/2014 09/11/13   Carmelina Dane, MD  HYDROcodone-acetaminophen (NORCO/VICODIN) 5-325 MG per tablet Take 1 tablet by mouth every 4 (four) hours as needed. 03/31/14   Rolland Porter, MD  metroNIDAZOLE (FLAGYL) 500 MG tablet Take 1 tablet (500 mg total) by mouth 2 (two) times daily. 03/31/14   Rolland Porter, MD  naproxen sodium (ANAPROX DS) 550 MG tablet Take 1 tablet (550 mg total) by mouth 2 (two) times daily with a meal. Patient not taking: Reported on 03/31/2014 09/11/13 09/11/14  Carmelina Dane, MD  ondansetron [facility]  ODT) 4 MG disintegrating tablet Take 1 tablet (4 mg total) by mouth every 8 (eight) hours as needed for nausea. 03/31/14   Rolland PorterMark Berel Najjar, MD  oxyCODONE-acetaminophen (ROXICET) 5-325 MG per tablet Take 1 tablet by mouth every 4 (four) hours as needed for severe pain. Patient not taking: Reported on 03/31/2014  09/11/13   Carmelina DaneJeffery S Anderson, MD   BP 119/72 mmHg  Pulse 76  Temp(Src) 98.4 F (36.9 C) (Oral)  Resp 16  SpO2 98% Physical Exam  Constitutional: He is oriented to person, place, and time. He appears well-developed and well-nourished. No distress.  HENT:  Head: Normocephalic.  Eyes: Conjunctivae are normal. Pupils are equal, round, and reactive to light. No scleral icterus.  Neck: Normal range of motion. Neck supple. No thyromegaly present.  Cardiovascular: Normal rate and regular rhythm.  Exam reveals no gallop and no friction rub.   No murmur heard. Pulmonary/Chest: Effort normal and breath sounds normal. No respiratory distress. He has no wheezes. He has no rales.  Abdominal: Soft. Bowel sounds are normal. He exhibits no distension. There is no tenderness. There is no rebound.    Minimal tenderness. No guarding rebound or peritoneal irritation.  Musculoskeletal: Normal range of motion.  Neurological: He is alert and oriented to person, place, and time.  Skin: Skin is warm and dry. No rash noted.  Psychiatric: He has a normal mood and affect. His behavior is normal.    ED Course  Procedures (including critical care time) Labs Review Labs Reviewed  BASIC METABOLIC PANEL - Abnormal; Notable for the following:    GFR calc non Af Amer 82 (*)    All other components within normal limits    Imaging Review Ct Abdomen Pelvis W Contrast  03/31/2014   CLINICAL DATA:  Epigastric abdominal pain for the last year, worsening over the last few days.  EXAM: CT ABDOMEN AND PELVIS WITH CONTRAST  TECHNIQUE: Multidetector CT imaging of the abdomen and pelvis was performed using the standard protocol following bolus administration of intravenous contrast.  CONTRAST:  100mL OMNIPAQUE IOHEXOL 300 MG/ML SOLN, 50mL OMNIPAQUE IOHEXOL 300 MG/ML SOLN  COMPARISON:  Abdominal radiography same day  FINDINGS: Lung bases show peripheral emphysema. No active parenchymal process.  The liver has a normal  appearance without focal lesions or biliary ductal dilatation. There is a calcified gallstone dependent within the gallbladder but no CT evidence of gallbladder inflammation. The spleen is normal. The pancreas is normal. The adrenal glands are normal. The kidneys are normal. The aorta and IVC are normal.  There is inflammatory change of the ascending colon in the midportion. The cecum is spared. The appendix is normal. There is inflammatory change in the surrounding fat. No evidence of abscess. No free air.  Bladder, prostate gland and seminal vesicles are normal.  No significant bony finding.  IMPRESSION: Inflammatory change of the ascending colon with sparing of the cecum and appendix. Surrounding edema in the pericolic fat. No frank abscess. The differential diagnosis includes inflammatory bowel disease, infectious colitis, and diverticulitis.   Electronically Signed   By: Paulina FusiMark  Shogry M.D.   On: 03/31/2014 18:22   Dg Abd Acute W/chest  03/31/2014   CLINICAL DATA:  Left lower quadrant pain.  Nausea.  Cough.  Asthma.  EXAM: ACUTE ABDOMEN SERIES (ABDOMEN 2 VIEW & CHEST 1 VIEW)  COMPARISON:  None.  FINDINGS: Air-fluid levels are seen in several nondilated bowel loops within the central abdomen. This is nonspecific, and may be seen with ileus or diarrheal  illnesses. There is no evidence of free air.  Heart size and mediastinal contours are normal. Both lungs are clear.  IMPRESSION: Nonspecific, nonobstructive bowel gas pattern.  No active cardiopulmonary disease.   Electronically Signed   By: Myles Rosenthal M.D.   On: 03/31/2014 16:17     EKG Interpretation None      MDM   Final diagnoses:  LLQ pain  Diverticulitis of large intestine without perforation or abscess without bleeding    I discussed the CT findings with patient. Asked him to call GI for follow-up. Think he'll need follow-up appointment and possible colonoscopy to rule out inflammatory bowel disease. With the onset of symptoms, his exam,  his CT findings, the fact that he is 63 never had a past similar episodes suggestive of inflammatory bowel disorder think this is very likely diverticulitis.    Rolland Porter, MD 03/31/14 [phone]

## 2014-03-31 NOTE — Discharge Instructions (Signed)
Your CT scan shows inflammation of the colon. This is very likely a diverticular infection. You will need a follow-up appointment with a GI doctor to schedule a colonoscopy to ensure that this does not represent an inflammatory bowel disorder such as Crohn's disease. Please call [facility] at the number above to schedule your appointment.  Diverticulitis Diverticulitis is when small pockets that have formed in your colon (large intestine) become infected or swollen. HOME CARE  Follow your doctor's instructions.  Follow a special diet if told by your doctor.  When you feel better, your doctor may tell you to change your diet. You may be told to eat a lot of fiber. Fruits and vegetables are good sources of fiber. Fiber makes it easier to poop (have bowel movements).  Take supplements or probiotics as told by your doctor.  Only take medicines as told by your doctor.  Keep all follow-up visits with your doctor. GET HELP IF:  Your pain does not get better.  You have a hard time eating food.  You are not pooping like normal. GET HELP RIGHT AWAY IF:  Your pain gets worse.  Your problems do not get better.  Your problems suddenly get worse.  You have a fever.  You keep throwing up (vomiting).  You have bloody or black, tarry poop (stool). MAKE SURE YOU:   Understand these instructions.  Will watch your condition.  Will get help right away if you are not doing well or get worse. Document Released: 07/21/2007 Document Revised: 02/06/2013 Document Reviewed: 12/27/2012 [facility] Patient Information 2015 [place] [place]. This information is not intended to replace advice given to you by your health care provider. Make sure you discuss any questions you have with your health care provider.

## 2014-03-31 NOTE — Progress Notes (Signed)
Subjective:    Patient ID: Omar Lawson, male    DOB: May 22, 1970, 44 y.o.   MRN: [id number]  HPI Epigastric abdominal pain for greater than one year, states worse past few days. Has taken pantoprazole with no relief. Pain resolved but has now returned, states he did get a shot at one point and the pain went away. He has been having sleep disturbance from the pain. States he feels woozy and nausea with the pain. He indicates he took a Pantoprazole last night. He did have chills and nausea last night, states he has had no vomiting, diarrhea and denies weight loss. He does state he did not have an appetite today, his wife made breakfast and he did not eat. No history of any abdominal surgery.  Problem #2 Asthma, using Albuterol inhaler(Ventolin) once or twice a week with good relief of symptoms. He denies shortness of breath. Has Qvar but not using Problem #3 Right shoulder pain, thinks from work or may be related to the abdominal pain. Has full function  Review of Systems  Constitutional: Positive for chills. Negative for fever, diaphoresis, activity change, appetite change, fatigue and unexpected weight change.  HENT: Negative for congestion, dental problem, drooling, ear discharge, ear pain, facial swelling, hearing loss, mouth sores, nosebleeds, postnasal drip, rhinorrhea, sinus pressure, sneezing, sore throat, tinnitus, trouble swallowing and voice change.   Eyes: Negative.   Respiratory: Positive for wheezing. Negative for apnea, cough, choking, chest tightness, shortness of breath and stridor.   Cardiovascular: Negative.   Gastrointestinal: Positive for nausea and abdominal pain. Negative for vomiting, diarrhea, constipation, blood in stool, abdominal distention, anal bleeding and rectal pain.  Endocrine: Negative.   Genitourinary: Negative.   Musculoskeletal: Negative.        Right shoulder pain  Skin: Negative.   Allergic/Immunologic: Negative for environmental allergies, food  allergies and immunocompromised state.  Neurological: Positive for dizziness. Negative for tremors, seizures, syncope, facial asymmetry, speech difficulty, weakness, light-headedness, numbness and headaches.  Hematological: Negative.   Psychiatric/Behavioral: Negative.        Objective:   Physical Exam  Constitutional: He is oriented to person, place, and time. He appears well-developed and well-nourished. No distress.  HENT:  Head: Normocephalic.  Right Ear: External ear normal.  Left Ear: External ear normal.  Nose: Nose normal.  Mouth/Throat: Oropharynx is clear and moist.  Eyes: Conjunctivae and EOM are normal. Pupils are equal, round, and reactive to light. No scleral icterus.  Neck: Normal range of motion.  Cardiovascular: Normal rate, regular rhythm and normal heart sounds.   Pulmonary/Chest: Effort normal. No tachypnea. No respiratory distress. He has decreased breath sounds. He has wheezes. He has no rhonchi. He has no rales.  PFR 400l/min Normal 550l/min  Abdominal: Normal appearance and bowel sounds are normal. He exhibits no distension and no mass. There is no hepatosplenomegaly. There is tenderness in the left lower quadrant. There is no rigidity, no rebound, no guarding, no CVA tenderness, no tenderness at McBurney's point and negative Murphy's sign. No hernia. Hernia confirmed negative in the right inguinal area and confirmed negative in the left inguinal area.    Point tender and reproduces pain.  Musculoskeletal: Normal range of motion.  Neurological: He is alert and oriented to person, place, and time. He exhibits normal muscle tone. Coordination normal.  Psychiatric: He has a normal mood and affect. His behavior is normal. Judgment and thought content normal.   Results for orders placed or performed in visit on 03/31/14  POCT CBC  Result Value Ref Range   WBC 13.6 (A) 4.6 - 10.2 K/uL   Lymph, poc 3.7 (A) 0.6 - 3.4   POC LYMPH PERCENT 27.0 10 - 50 %L   MID (cbc)  0.9 0 - 0.9   POC MID % 6.4 0 - 12 %M   POC Granulocyte 9.1 (A) 2 - 6.9   Granulocyte percent 66.6 37 - 80 %G   RBC 5.24 4.69 - 6.13 M/uL   Hemoglobin 15.0 14.1 - 18.1 g/dL   HCT, POC 40.946.4 81.143.5 - 53.7 %   MCV 88.5 80 - 97 fL   MCH, POC 28.6 27 - 31.2 pg   MCHC 32.3 31.8 - 35.4 g/dL   RDW, POC 91.414.1 %   Platelet Count, POC 218 142 - 424 K/uL   MPV 7.8 0 - 99.8 fL  POCT UA - Microscopic Only  Result Value Ref Range   WBC, Ur, HPF, POC 0-1    RBC, urine, microscopic 0-1    Bacteria, U Microscopic trace    Mucus, UA neg    Epithelial cells, urine per micros 0-2    Crystals, Ur, HPF, POC neg    Casts, Ur, LPF, POC neg    Yeast, UA neg   POCT urinalysis dipstick  Result Value Ref Range   Color, UA yellow    Clarity, UA clear    Glucose, UA neg    Bilirubin, UA neg    Ketones, UA 15    Spec Grav, UA 1.020    Blood, UA neg    pH, UA 6.0    Protein, UA neg    Urobilinogen, UA 0.2    Nitrite, UA neg    Leukocytes, UA Negative    Tenderness on exam over left lower quadrant  Peak flow 400, expected peak flow is 550  Expiratory wheezing noted on exam Heart rate and rhythm normal   [facility] reading (PRIMARY) by  Dr.Guest.air fluid levels, probable ileus, hyperinflation  Nebulizer/PFR post neb 600l/min! Improvement! Needs steroid inhaler to prevent    Assessment & Plan:  LLQ abdominal pain/Consider diverticulitis Asthma -moderate not to [facility] and surgical consult/referral made by me

## 2014-03-31 NOTE — Progress Notes (Signed)
   Subjective:    Patient ID: Omar RumpfChristopher Eyman, male    DOB: Jun 19, 1970, 44 y.o.   MRN: [id number]  HPI    Review of Systems     Objective:   Physical Exam        Assessment & Plan:

## 2014-03-31 NOTE — ED Notes (Signed)
PT alert, oriented, and ambulatory upon DC. He was advised to follow up with GI and schedule and colonoscopy.

## 2014-03-31 NOTE — ED Notes (Signed)
Per pt, states abdominal pain-worked up at [facility]-sent here for CT

## 2014-03-31 NOTE — Patient Instructions (Addendum)
[facility] for [facility] CT scan, and Emergency room evaluation. Dr Perrin MalteseGuest has spoken to the General surgeon on call[facility], who is aware you are going to the Emergency Room.  For your asthma, use Q var inhaler every day, this will help with the lungs, the Ventolin is for rescue or before exercising (Albuterol) Asthma Asthma is a recurring condition in which the airways tighten and narrow. Asthma can make it difficult to breathe. It can cause coughing, wheezing, and shortness of breath. Asthma episodes, also called asthma attacks, range from minor to life-threatening. Asthma cannot be cured, but medicines and lifestyle changes can help control it. CAUSES Asthma is believed to be caused by inherited (genetic) and environmental factors, but its exact cause is unknown. Asthma may be triggered by allergens, lung infections, or irritants in the air. Asthma triggers are different for each person. Common triggers include:   Animal dander.  Dust mites.  Cockroaches.  Pollen from trees or grass.  Mold.  Smoke.  Air pollutants such as dust, household cleaners, hair sprays, aerosol sprays, paint fumes, strong chemicals, or strong odors.  Cold air, weather changes, and winds (which increase molds and pollens in the air).  Strong emotional expressions such as crying or laughing hard.  Stress.  Certain medicines (such as aspirin) or types of drugs (such as beta-blockers).  Sulfites in foods and drinks. Foods and drinks that may contain sulfites include dried fruit, potato chips, and sparkling grape juice.  Infections or inflammatory conditions such as the flu, a cold, or an inflammation of the nasal membranes (rhinitis).  Gastroesophageal reflux disease (GERD).  Exercise or strenuous activity. SYMPTOMS Symptoms may occur immediately after asthma is triggered or many hours later. Symptoms include:  Wheezing.  Excessive nighttime or early morning  coughing.  Frequent or severe coughing with a common cold.  Chest tightness.  Shortness of breath. DIAGNOSIS  The diagnosis of asthma is made by a review of your medical history and a physical exam. Tests may also be performed. These may include:  Lung function studies. These tests show how much air you breathe in and out.  Allergy tests.  Imaging tests such as X-rays. TREATMENT  Asthma cannot be cured, but it can usually be controlled. Treatment involves identifying and avoiding your asthma triggers. It also involves medicines. There are 2 classes of medicine used for asthma treatment:   Controller medicines. These prevent asthma symptoms from occurring. They are usually taken every day.  Reliever or rescue medicines. These quickly relieve asthma symptoms. They are used as needed and provide short-term relief. Your health care provider will help you create an asthma action plan. An asthma action plan is a written plan for managing and treating your asthma attacks. It includes a list of your asthma triggers and how they may be avoided. It also includes information on when medicines should be taken and when their dosage should be changed. An action plan may also involve the use of a device called a peak flow meter. A peak flow meter measures how well the lungs are working. It helps you monitor your condition. HOME CARE INSTRUCTIONS   Take medicines only as directed by your health care provider. Speak with your health care provider if you have questions about how or when to take the medicines.  Use a peak flow meter as directed by your health care provider. Record and keep track of readings.  Understand and use the action plan to help  minimize or stop an asthma attack without needing to seek medical care.  Control your home environment in the following ways to help prevent asthma attacks:  Do not smoke. Avoid being exposed to secondhand smoke.  Change your heating and air conditioning  filter regularly.  Limit your use of fireplaces and wood stoves.  Get rid of pests (such as roaches and mice) and their droppings.  Throw away plants if you see mold on them.  Clean your floors and dust regularly. Use unscented cleaning products.  Try to have someone else vacuum for you regularly. Stay out of rooms while they are being vacuumed and for a short while afterward. If you vacuum, use a dust mask from a hardware store, a double-layered or microfilter vacuum cleaner bag, or a vacuum cleaner with a HEPA filter.  Replace carpet with wood, tile, or vinyl flooring. Carpet can trap dander and dust.  Use allergy-proof pillows, mattress covers, and box spring covers.  Wash bed sheets and blankets every week in hot water and dry them in a dryer.  Use blankets that are made of polyester or cotton.  Clean bathrooms and kitchens with bleach. If possible, have someone repaint the walls in these rooms with mold-resistant paint. Keep out of the rooms that are being cleaned and painted.  Wash hands frequently. SEEK MEDICAL CARE IF:   You have wheezing, shortness of breath, or a cough even if taking medicine to prevent attacks.  The colored mucus you cough up (sputum) is thicker than usual.  Your sputum changes from clear or white to yellow, green, gray, or bloody.  You have any problems that may be related to the medicines you are taking (such as a rash, itching, swelling, or trouble breathing).  You are using a reliever medicine more than 2-3 times per week.  Your peak flow is still at 50-79% of your personal best after following your action plan for 1 hour.  You have a fever. SEEK IMMEDIATE MEDICAL CARE IF:   You seem to be getting worse and are unresponsive to treatment during an asthma attack.  You are short of breath even at rest.  You get short of breath when doing very little physical activity.  You have difficulty eating, drinking, or talking due to asthma  symptoms.  You develop chest pain.  You develop a fast heartbeat.  You have a bluish color to your lips or fingernails.  You are light-headed, dizzy, or faint.  Your peak flow is less than 50% of your personal best. MAKE SURE YOU:   Understand these instructions.  Will watch your condition.  Will get help right away if you are not doing well or get worse. Document Released: 02/01/2005 Document Revised: 06/18/2013 Document Reviewed: 08/31/2012 [facility] Patient Information 2015 [place], [place]. This information is not intended to replace advice given to you by your health care provider. Make sure you discuss any questions you have with your health care provider. Diverticulitis Diverticulitis is inflammation or infection of small pouches in your colon that form when you have a condition called diverticulosis. The pouches in your colon are called diverticula. Your colon, or large intestine, is where water is absorbed and stool is formed. Complications of diverticulitis can include:  Bleeding.  Severe infection.  Severe pain.  Perforation of your colon.  Obstruction of your colon. CAUSES  Diverticulitis is caused by bacteria. Diverticulitis happens when stool becomes trapped in diverticula. This allows bacteria to grow in the diverticula, which can lead to inflammation  and infection. RISK FACTORS People with diverticulosis are at risk for diverticulitis. Eating a diet that does not include enough fiber from fruits and vegetables may make diverticulitis more likely to develop. SYMPTOMS  Symptoms of diverticulitis may include:  Abdominal pain and tenderness. The pain is normally located on the left side of the abdomen, but may occur in other areas.  Fever and chills.  Bloating.  Cramping.  Nausea.  Vomiting.  Constipation.  Diarrhea.  Blood in your stool. DIAGNOSIS  Your health care provider will ask you about your medical history and do a physical exam. You may  need to have tests done because many medical conditions can cause the same symptoms as diverticulitis. Tests may include:  Blood tests.  Urine tests.  Imaging tests of the abdomen, including X-rays and CT scans. When your condition is under control, your health care provider may recommend that you have a colonoscopy. A colonoscopy can show how severe your diverticula are and whether something else is causing your symptoms. TREATMENT  Most cases of diverticulitis are mild and can be treated at home. Treatment may include:  Taking over-the-counter pain medicines.  Following a clear liquid diet.  Taking antibiotic medicines by mouth for 7-10 days. More severe cases may be treated at a hospital. Treatment may include:  Not eating or drinking.  Taking prescription pain medicine.  Receiving antibiotic medicines through an IV tube.  Receiving fluids and nutrition through an IV tube.  Surgery. HOME CARE INSTRUCTIONS   Follow your health care provider's instructions carefully.  Follow a full liquid diet or other diet as directed by your health care provider. After your symptoms improve, your health care provider may tell you to change your diet. He or she may recommend you eat a high-fiber diet. Fruits and vegetables are good sources of fiber. Fiber makes it easier to pass stool.  Take fiber supplements or probiotics as directed by your health care provider.  Only take medicines as directed by your health care provider.  Keep all your follow-up appointments. SEEK MEDICAL CARE IF:   Your pain does not improve.  You have a hard time eating food.  Your bowel movements do not return to normal. SEEK IMMEDIATE MEDICAL CARE IF:   Your pain becomes worse.  Your symptoms do not get better.  Your symptoms suddenly get worse.  You have a fever.  You have repeated vomiting.  You have bloody or black, tarry stools. MAKE SURE YOU:   Understand these instructions.  Will watch  your condition.  Will get help right away if you are not doing well or get worse. Document Released: 11/11/2004 Document Revised: 02/06/2013 Document Reviewed: 12/27/2012 [facility] Patient Information 2015 [place], [place]. This information is not intended to replace advice given to you by your health care provider. Make sure you discuss any questions you have with your health care provider.

## 2015-05-28 ENCOUNTER — Emergency Department (HOSPITAL_COMMUNITY)
Admission: EM | Admit: 2015-05-28 | Discharge: 2015-05-28 | Disposition: A | Discharge status: Home / Self Care | Payer: No Typology Code available for payment source | Attending: Emergency Medicine | Admitting: Emergency Medicine

## 2015-05-28 ENCOUNTER — Encounter (HOSPITAL_COMMUNITY): Payer: Self-pay | Admitting: *Deleted

## 2015-05-28 DIAGNOSIS — Z79899 Other long term (current) drug therapy: Secondary | ICD-10-CM | POA: Insufficient documentation

## 2015-05-28 DIAGNOSIS — Z7951 Long term (current) use of inhaled steroids: Secondary | ICD-10-CM | POA: Insufficient documentation

## 2015-05-28 DIAGNOSIS — J45901 Unspecified asthma with (acute) exacerbation: Secondary | ICD-10-CM

## 2015-05-28 DIAGNOSIS — R079 Chest pain, unspecified: Secondary | ICD-10-CM | POA: Insufficient documentation

## 2015-05-28 MED ORDER — PREDNISONE 10 MG (21) PO TBPK: 10.0000 mg | ORAL_TABLET | Freq: Every day | ORAL | Status: DC

## 2015-05-28 MED ORDER — CETIRIZINE HCL 10 MG PO CAPS: 10.0000 mg | ORAL_CAPSULE | Freq: Every day | ORAL | Status: AC

## 2015-05-28 MED ORDER — PREDNISONE 20 MG PO TABS
60.0000 mg | ORAL_TABLET | Freq: Once | ORAL | Status: AC
  Administered 2015-05-28: 60 mg via ORAL
  Filled 2015-05-28: qty 3

## 2015-05-28 MED ORDER — ALBUTEROL SULFATE HFA 108 (90 BASE) MCG/ACT IN AERS
1.0000 | INHALATION_SPRAY | Freq: Once | RESPIRATORY_TRACT | Status: AC
  Administered 2015-05-28: 1 via RESPIRATORY_TRACT
  Filled 2015-05-28: qty 6.7

## 2015-05-28 MED ORDER — IPRATROPIUM-ALBUTEROL 0.5-2.5 (3) MG/3ML IN SOLN
3.0000 mL | Freq: Once | RESPIRATORY_TRACT | Status: AC
  Administered 2015-05-28: 3 mL via RESPIRATORY_TRACT
  Filled 2015-05-28: qty 3

## 2015-05-28 NOTE — Discharge Instructions (Signed)
Asthma, Acute Bronchospasm °Acute bronchospasm caused by asthma is also referred to as an asthma attack. Bronchospasm means your air passages become narrowed. The narrowing is caused by inflammation and tightening of the muscles in the air tubes (bronchi) in your lungs. This can make it hard to breathe or cause you to wheeze and cough. °CAUSES °Possible triggers are: °· Animal dander from the skin, hair, or feathers of animals. °· Dust mites contained in house dust. °· Cockroaches. °· Pollen from trees or grass. °· Mold. °· Cigarette or tobacco smoke. °· Air pollutants such as dust, household cleaners, hair sprays, aerosol sprays, paint fumes, strong chemicals, or strong odors. °· Cold air or weather changes. Cold air may trigger inflammation. Winds increase molds and pollens in the air. °· Strong emotions such as crying or laughing hard. °· Stress. °· Certain medicines such as aspirin or beta-blockers. °· Sulfites in foods and drinks, such as dried fruits and wine. °· Infections or inflammatory conditions, such as a flu, cold, or inflammation of the nasal membranes (rhinitis). °· Gastroesophageal reflux disease (GERD). GERD is a condition where stomach acid backs up into your esophagus. °· Exercise or strenuous activity. °SIGNS AND SYMPTOMS  °· Wheezing. °· Excessive coughing, particularly at night. °· Chest tightness. °· Shortness of breath. °DIAGNOSIS  °Your health care provider will ask you about your medical history and perform a physical exam. A chest X-ray or blood testing may be performed to look for other causes of your symptoms or other conditions that may have triggered your asthma attack.  °TREATMENT  °Treatment is aimed at reducing inflammation and opening up the airways in your lungs.  Most asthma attacks are treated with inhaled medicines. These include quick relief or rescue medicines (such as bronchodilators) and controller medicines (such as inhaled corticosteroids). These medicines are sometimes  given through an inhaler or a nebulizer. Systemic steroid medicine taken by mouth or given through an IV tube also can be used to reduce the inflammation when an attack is moderate or severe. Antibiotic medicines are only used if a bacterial infection is present.  °HOME CARE INSTRUCTIONS  °· Rest. °· Drink plenty of liquids. This helps the mucus to remain thin and be easily coughed up. Only use caffeine in moderation and do not use alcohol until you have recovered from your illness. °· Do not smoke. Avoid being exposed to secondhand smoke. °· You play a critical role in keeping yourself in good health. Avoid exposure to things that cause you to wheeze or to have breathing problems. °· Keep your medicines up-to-date and available. Carefully follow your health care provider's treatment plan. °· Take your medicine exactly as prescribed. °· When pollen or pollution is bad, keep windows closed and use an air conditioner or go to places with air conditioning. °· Asthma requires careful medical care. See your health care provider for a follow-up as advised. If you are more than [redacted] weeks pregnant and you were prescribed any new medicines, let your obstetrician know about the visit and how you are doing. Follow up with your health care provider as directed. °· After you have recovered from your asthma attack, make an appointment with your outpatient doctor to talk about ways to reduce the likelihood of future attacks. If you do not have a doctor who manages your asthma, make an appointment with a primary care doctor to discuss your asthma. °SEEK IMMEDIATE MEDICAL CARE IF:  °· You are getting worse. °· You have trouble breathing. If severe, call your local   emergency services (911 in the [place]  You develop chest pain or discomfort.  You are vomiting.  You are not able to keep fluids down.  You are coughing up yellow, green, brown, or bloody sputum.  You have a fever and your symptoms suddenly get worse.  You have  trouble swallowing. MAKE SURE YOU:   Understand these instructions.  Will watch your condition.  Will get help right away if you are not doing well or get worse.   This information is not intended to replace advice given to you by your health care provider. Make sure you discuss any questions you have with your health care provider.  Take prednisone as prescribed. Take Zyrtec daily. Use albuterol inhaler as needed for wheezing and shortness of breath. Please return to the emergency department if you experience severe worsening of your symptoms, difficulty breathing, fever, cough, chills, chest pain.

## 2015-05-28 NOTE — ED Provider Notes (Signed)
CSN: [id number]     Arrival date & time 05/28/15  6578 History  By signing my name below, I, Omar Lawson, attest that this documentation has been prepared under the direction and in the presence of [employer], PA-C. Electronically Signed: Angelene Lawson, ED Scribe. 05/28/2015. 10:18 AM.    Chief Complaint  Patient presents with  . Asthma   The history is provided by the patient. No language interpreter was used.   HPI Comments: Omar Lawson is a 45 y.o. male with a hx of asthma and seasonal allergies who presents to the Emergency Department requesting an albuterol and inhaler refill due to his asthma flare up. Pt states that over the last few days he has noticed increased wheezing and increased SOB. Pt states that this typically happens every years during spring when his allergies act up. Pt has associated persistent non-productive cough, sneezing.  Pt has not tried any medications PTA. He explains that he has not used his inhaler and albuterol for several months as he has been out of his prescription. He denies any other complaints at this time. No fever, chills, or n/v.    Past Medical History  Diagnosis Date  . Allergy   . Asthma    History reviewed. No pertinent past surgical history. Family History  Problem Relation Age of Onset  . Stroke Mother    Social History  Substance Use Topics  . Smoking status: Never Smoker   . Smokeless tobacco: Never Used  . Alcohol Use: 0.0 oz/week    0 Standard drinks or equivalent per week    Review of Systems  Constitutional: Negative for fever and chills.  Respiratory: Positive for cough, chest tightness and wheezing.   Gastrointestinal: Negative for nausea and vomiting.      Allergies  Review of patient's allergies indicates no known allergies.  Home Medications   Prior to Admission medications   Medication Sig Start Date End Date Taking? Authorizing Provider  albuterol (PROVENTIL HFA;VENTOLIN HFA) 108 (90 BASE)  MCG/ACT inhaler Inhale 1-2 puffs into the lungs every 4 (four) hours as needed for wheezing or shortness of breath. 03/31/14   Jonita Albee, MD  beclomethasone (QVAR) 40 MCG/ACT inhaler Inhale 1 puff into the lungs 2 (two) times daily. 03/31/14   Jonita Albee, MD  ciprofloxacin (CIPRO) 500 MG tablet Take 1 tablet (500 mg total) by mouth every 12 (twelve) hours. 03/31/14   Rolland Porter, MD  cyclobenzaprine (FLEXERIL) 10 MG tablet Take 1 tablet (10 mg total) by mouth 3 (three) times daily as needed for muscle spasms. Patient not taking: Reported on 03/31/2014 09/11/13   Carmelina Dane, MD  fluticasone [facility]) 50 MCG/ACT nasal spray Place 2 sprays into both nostrils daily. 06/19/13   Shade Flood, MD  HYDROcodone-acetaminophen (NORCO/VICODIN) 5-325 MG per tablet Take 1 tablet by mouth every 4 (four) hours as needed. 03/31/14   Rolland Porter, MD  metroNIDAZOLE (FLAGYL) 500 MG tablet Take 1 tablet (500 mg total) by mouth 2 (two) times daily. 03/31/14   Rolland Porter, MD  Multiple Vitamin (MULTIVITAMIN WITH MINERALS) TABS tablet Take 1 tablet by mouth daily.    Historical Provider, MD  ondansetron (ZOFRAN ODT) 4 MG disintegrating tablet Take 1 tablet (4 mg total) by mouth every 8 (eight) hours as needed for nausea. 03/31/14   Rolland Porter, MD  oxyCODONE-acetaminophen (ROXICET) 5-325 MG per tablet Take 1 tablet by mouth every 4 (four) hours as needed for severe pain. Patient not taking: Reported on 03/31/2014  09/11/13   Carmelina DaneJeffery S Anderson, MD   BP 143/92 mmHg  Pulse 64  Temp(Src) 97.7 F (36.5 C) (Oral)  Resp 22  SpO2 95% Physical Exam  Constitutional: He is oriented to person, place, and time. He appears well-developed and well-nourished. No distress.  HENT:  Head: Normocephalic and atraumatic.  Eyes: Conjunctivae are normal. Right eye exhibits no discharge. Left eye exhibits no discharge. No scleral icterus.  Cardiovascular: Normal rate, regular rhythm, normal heart sounds and intact distal pulses.  Exam  reveals no gallop and no friction rub.   No murmur heard. Pulmonary/Chest: Effort normal. No accessory muscle usage. He exhibits no retraction.  Diffuse expiratory and inspiratory wheezes in all fields. No respiratory distress, pt speaking in complete sentences.   Neurological: He is alert and oriented to person, place, and time. Coordination normal.  Skin: Skin is warm and dry. No rash noted. He is not diaphoretic. No erythema. No pallor.  Psychiatric: He has a normal mood and affect. His behavior is normal.  Nursing note and vitals reviewed.   ED Course  Procedures (including critical care time) DIAGNOSTIC STUDIES: Oxygen Saturation is 95% on RA, normal by my interpretation.    COORDINATION OF CARE: 10:05 AM- Pt advised of plan for treatment and pt agrees. Pt will receiveDuoneb treatment here. He will receive a refill for his prescriptions and prednisone. Advised to add Zyrtec to his allergy medication regime.   MDM   Final diagnoses:  Asthma, unspecified asthma severity, with acute exacerbation   45 y.o M with hx of asthma, seasonal allergies that presents to the ED with asthma exacerbation due to seasonal allergies, pollen exposure. Wheezing noted on exam. No signs of respiratory distress. No hypoxia. Pt speaking in complete sentences. Lung exam much improved after nebulizer treatment. Prednisone given in the ED and pt will bd dc with 5 day burst. Albuterol inhaler refilled. Pt states they are breathing at baseline. Pt has been instructed to continue using prescribed medications and to speak with PCP about today's exacerbation. Pt also given prescription for Zyrtec to take daily to control seasonal allergies and prevention of future asthma exacerbations.    I personally performed the services described in this documentation, which was scribed in my presence. The recorded information has been reviewed and is accurate.     Lester KinsmanSamantha Tripp CambridgeDowless, PA-C 05/28/15 1037  Gwyneth SproutWhitney  Plunkett, MD 05/28/15 2109

## 2015-05-28 NOTE — ED Notes (Signed)
Pt reports he is out of his asthma Meds and had a flair up of his asthma. Pt uses a inhaler .

## 2015-05-28 NOTE — ED Notes (Signed)
Declined W/C at D/C and was escorted to lobby by RN. 

## 2015-08-24 IMAGING — CT CT ABD-PELV W/ CM
1 of 3 series · 14 of 32 positions shown, 19 images · IV contrast (OMNIPAQUE 300)
Comparison: Abdominal radiography same day

CLINICAL DATA: Epigastric abdominal pain for the last year,
worsening over the last few days.

EXAM:
CT ABDOMEN AND PELVIS WITH CONTRAST
TECHNIQUE: Multidetector CT imaging of the abdomen and pelvis was performed
using the standard protocol following bolus administration of
intravenous contrast.
CONTRAST:  100mL OMNIPAQUE IOHEXOL 300 MG/ML SOLN, 50mL OMNIPAQUE
IOHEXOL 300 MG/ML SOLN

[Series 2: abd/pel with · axial · 0.66mm/px · z∈[-257,+173]mm · 14 of 96 slices shown, 19 images]
[im 5/96  soft-tissue]
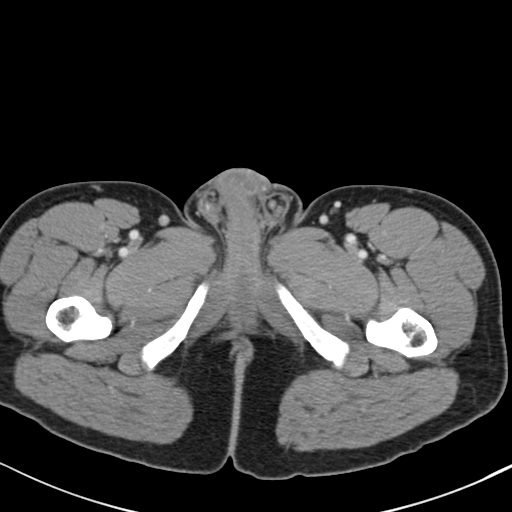
[im 5/96  bone]
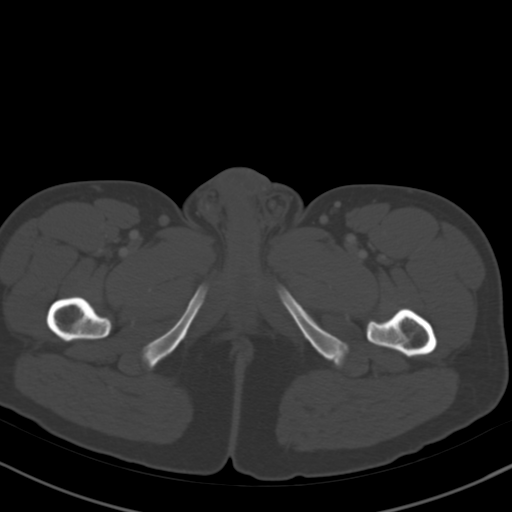
[im 14/96  soft-tissue]
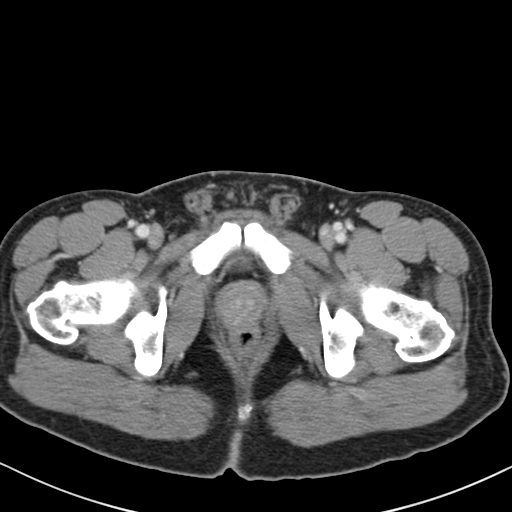
[im 19/96  soft-tissue]
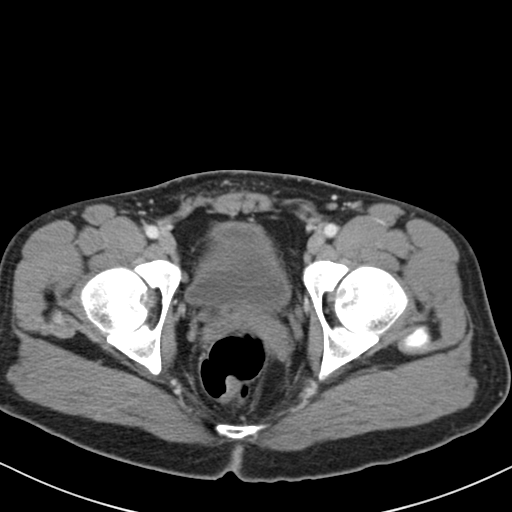
[im 28/96  soft-tissue]
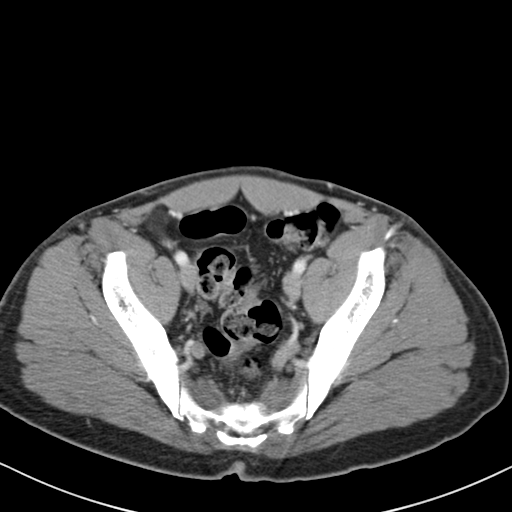
[im 32/96  soft-tissue]
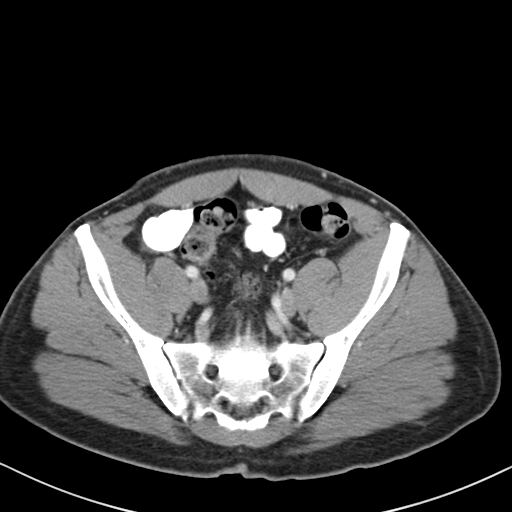
[im 41/96  soft-tissue]
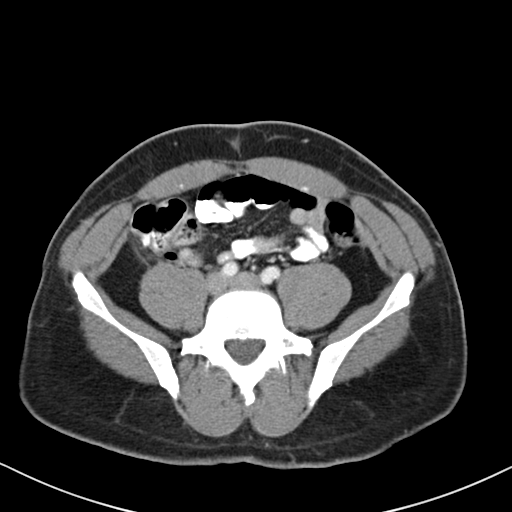
[im 50/96  soft-tissue]
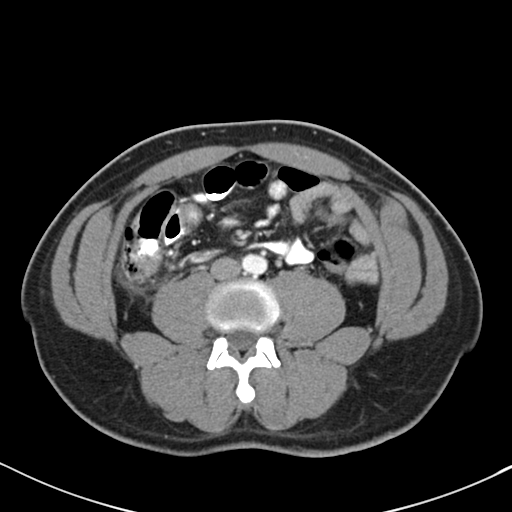
[im 55/96  soft-tissue]
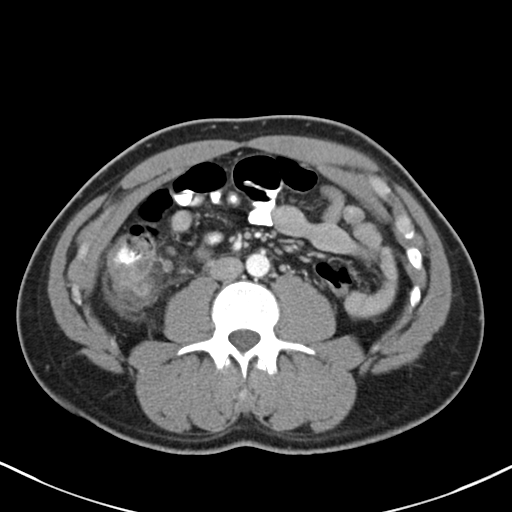
[im 64/96  soft-tissue]
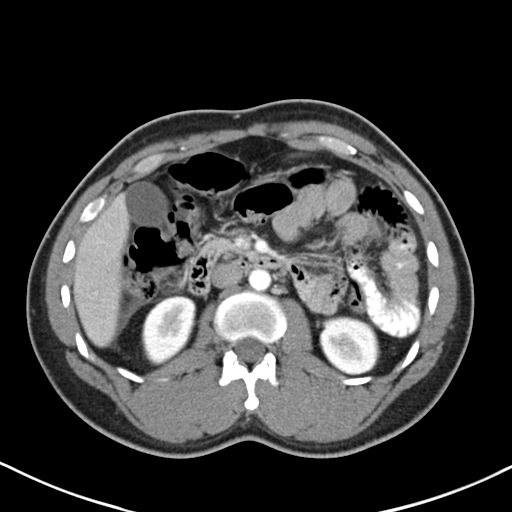
[im 64/96  bone]
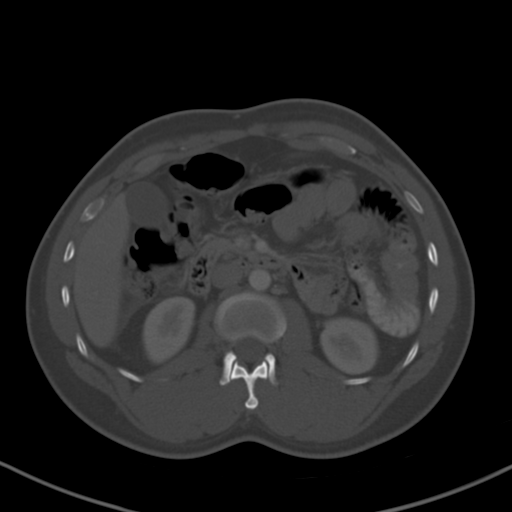
[im 68/96  soft-tissue]
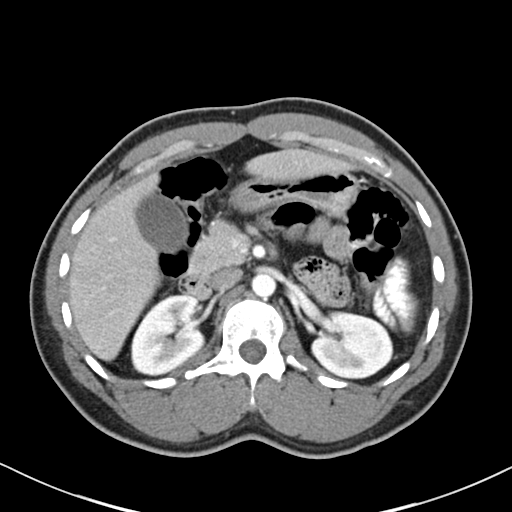
[im 77/96  soft-tissue]
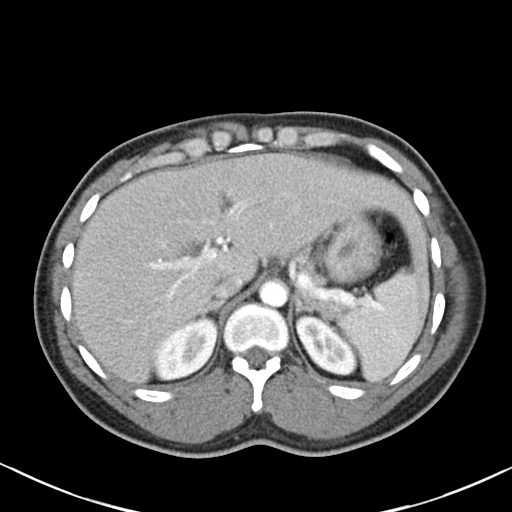
[im 77/96  lung]
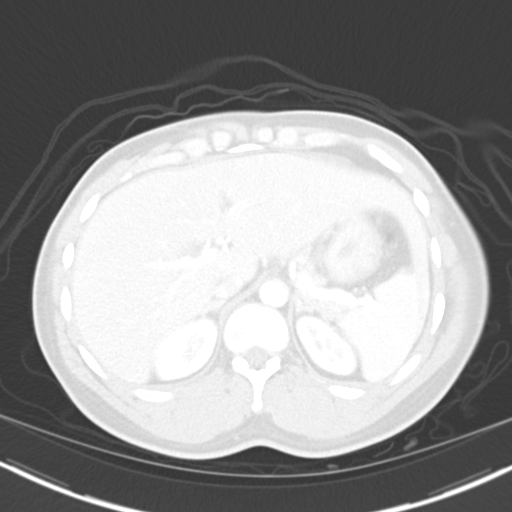
[im 82/96  soft-tissue]
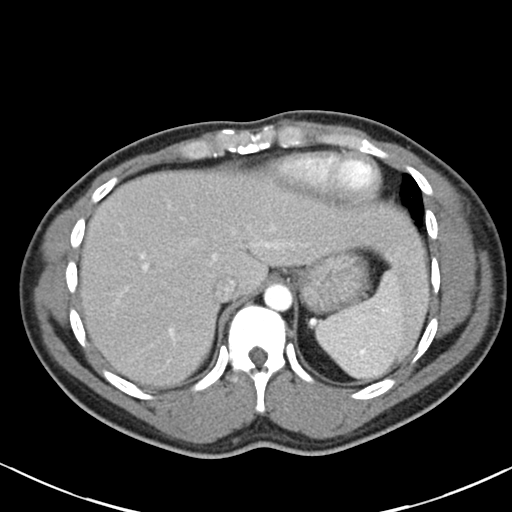
[im 82/96  lung]
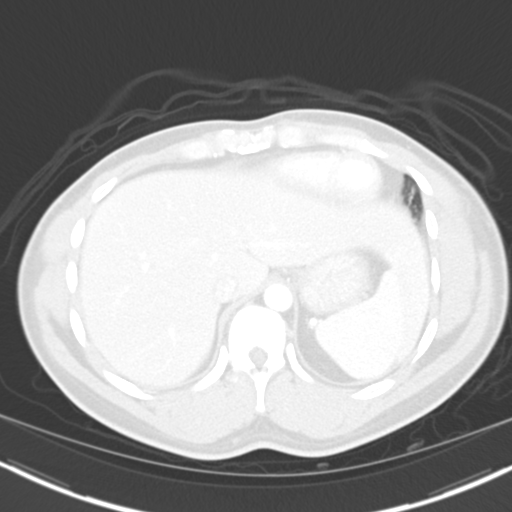
[im 86/96  lung]
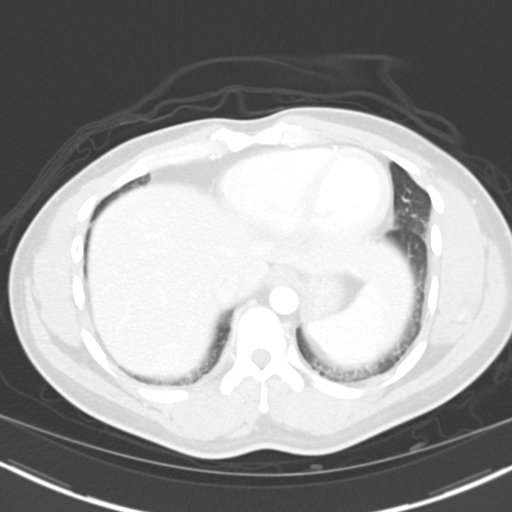
[im 91/96  soft-tissue]
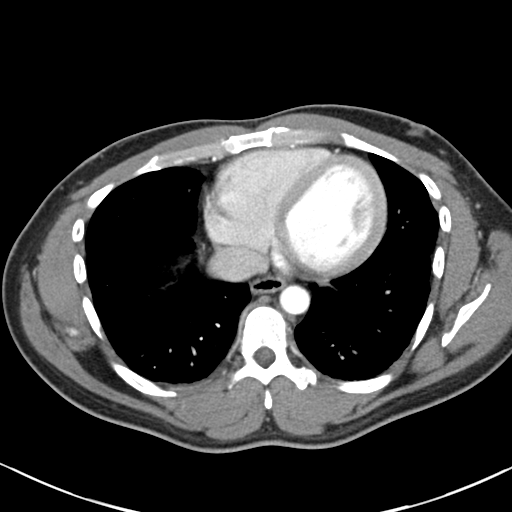
[im 91/96  lung]
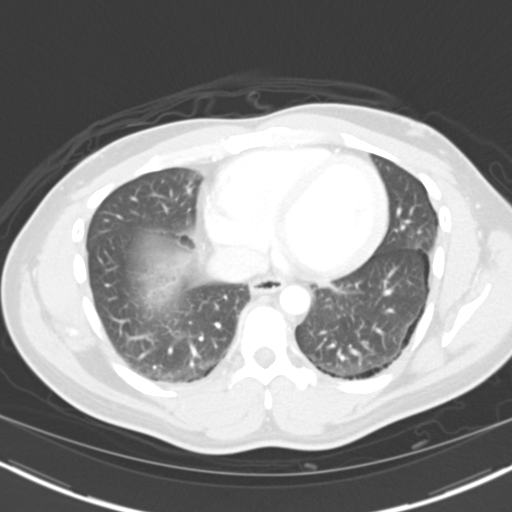

[14 of 32 positions shown; findings below may reference images not displayed]

FINDINGS: Lung bases show peripheral emphysema. No active parenchymal process.

The liver has a normal appearance without focal lesions or biliary
ductal dilatation. There is a calcified gallstone dependent within
the gallbladder but no CT evidence of gallbladder inflammation. The
spleen is normal. The pancreas is normal. The adrenal glands are
normal. The kidneys are normal. The aorta and IVC are normal.

There is inflammatory change of the ascending colon in the
midportion. The cecum is spared. The appendix is normal. There is
inflammatory change in the surrounding fat. No evidence of abscess.
No free air.

Bladder, prostate gland and seminal vesicles are normal.

No significant bony finding.
IMPRESSION: Inflammatory change of the ascending colon with sparing of the cecum
and appendix. Surrounding edema in the pericolic fat. No frank
abscess. The differential diagnosis includes inflammatory bowel
disease, infectious colitis, and diverticulitis.

## 2016-07-06 ENCOUNTER — Emergency Department (HOSPITAL_COMMUNITY)
Admission: EM | Admit: 2016-07-06 | Discharge: 2016-07-06 | Disposition: A | Discharge status: Home / Self Care | Payer: 59 | Attending: Emergency Medicine | Admitting: Emergency Medicine

## 2016-07-06 ENCOUNTER — Encounter (HOSPITAL_COMMUNITY): Payer: Self-pay

## 2016-07-06 DIAGNOSIS — Z79899 Other long term (current) drug therapy: Secondary | ICD-10-CM | POA: Diagnosis not present

## 2016-07-06 DIAGNOSIS — J4521 Mild intermittent asthma with (acute) exacerbation: Secondary | ICD-10-CM | POA: Diagnosis not present

## 2016-07-06 DIAGNOSIS — J4530 Mild persistent asthma, uncomplicated: Secondary | ICD-10-CM

## 2016-07-06 DIAGNOSIS — R0602 Shortness of breath: Secondary | ICD-10-CM | POA: Diagnosis present

## 2016-07-06 MED ORDER — ALBUTEROL SULFATE HFA 108 (90 BASE) MCG/ACT IN AERS
1.0000 | INHALATION_SPRAY | RESPIRATORY_TRACT | Status: DC | PRN
  Administered 2016-07-06: 2 via RESPIRATORY_TRACT
  Filled 2016-07-06: qty 6.7

## 2016-07-06 MED ORDER — ALBUTEROL SULFATE (2.5 MG/3ML) 0.083% IN NEBU
2.5000 mg | INHALATION_SOLUTION | Freq: Once | RESPIRATORY_TRACT | Status: AC
  Administered 2016-07-06: 2.5 mg via RESPIRATORY_TRACT
  Filled 2016-07-06: qty 3

## 2016-07-06 MED ORDER — MONTELUKAST SODIUM 10 MG PO TABS
10.0000 mg | ORAL_TABLET | Freq: Every day | ORAL | 0 refills | Status: AC
Start: 2016-07-06 — End: ?

## 2016-07-06 MED ORDER — ALBUTEROL SULFATE (2.5 MG/3ML) 0.083% IN NEBU
INHALATION_SOLUTION | RESPIRATORY_TRACT | Status: AC
  Filled 2016-07-06: qty 6

## 2016-07-06 MED ORDER — PREDNISONE 10 MG (21) PO TBPK
10.0000 mg | ORAL_TABLET | Freq: Every day | ORAL | 0 refills | Status: AC
Start: 2016-07-06 — End: ?

## 2016-07-06 MED ORDER — ALBUTEROL SULFATE (2.5 MG/3ML) 0.083% IN NEBU
5.0000 mg | INHALATION_SOLUTION | Freq: Once | RESPIRATORY_TRACT | Status: AC
  Administered 2016-07-06: 5 mg via RESPIRATORY_TRACT

## 2016-07-06 MED ORDER — ALBUTEROL SULFATE HFA 108 (90 BASE) MCG/ACT IN AERS: 1.0000 | INHALATION_SPRAY | RESPIRATORY_TRACT | 1 refills | Status: AC | PRN

## 2016-07-06 MED ORDER — BECLOMETHASONE DIPROPIONATE 40 MCG/ACT IN AERS: 1.0000 | INHALATION_SPRAY | Freq: Two times a day (BID) | RESPIRATORY_TRACT | 1 refills | Status: AC

## 2016-07-06 MED ORDER — AEROCHAMBER PLUS FLO-VU MEDIUM MISC
1.0000 | Freq: Once | Status: AC
  Administered 2016-07-06: 1
  Filled 2016-07-06: qty 1

## 2016-07-06 MED ORDER — PREDNISONE 20 MG PO TABS
60.0000 mg | ORAL_TABLET | Freq: Once | ORAL | Status: AC
Start: 2016-07-06 — End: 2016-07-06
  Administered 2016-07-06: 60 mg via ORAL
  Filled 2016-07-06: qty 3

## 2016-07-06 NOTE — ED Provider Notes (Signed)
[facility]-EMERGENCY DEPT Provider Note   CSN: [id number] Arrival date & time: 07/06/16  1656     History   Chief Complaint Chief Complaint  Patient presents with  . Asthma    HPI Omar Lawson is a 46 y.o. male.  Pt has a hx of asthma and presents to the ED today with sob.  The pt said that he is out of all of his meds.  He said sx started a few days ago, but got worse last night.  Pt does not smoke, but is around smokers.  Pt denies any f/c.  Pt given 5 mg albuterol in triage while waiting.  This has helped, but it has not relieved sx.      Past Medical History:  Diagnosis Date  . Allergy   . Asthma     Patient Active Problem List   Diagnosis Date Noted  . Extrinsic asthma, unspecified 06/19/2013  . Allergic rhinitis 06/19/2013    History reviewed. No pertinent surgical history.     Home Medications    Prior to Admission medications   Medication Sig Start Date End Date Taking? Authorizing Provider  albuterol (PROVENTIL HFA;VENTOLIN HFA) 108 (90 Base) MCG/ACT inhaler Inhale 1-2 puffs into the lungs every 4 (four) hours as needed for wheezing or shortness of breath. 07/06/16   Jacalyn LefevreHaviland, Cheyann Blecha, MD  beclomethasone (QVAR) 40 MCG/ACT inhaler Inhale 1 puff into the lungs 2 (two) times daily. 07/06/16   Jacalyn LefevreHaviland, Astella Desir, MD  Cetirizine HCl 10 MG CAPS Take 1 capsule (10 mg total) by mouth daily. 05/28/15   Dowless, Lelon MastSamantha Tripp, PA-C  ciprofloxacin (CIPRO) 500 MG tablet Take 1 tablet (500 mg total) by mouth every 12 (twelve) hours. 03/31/14   Rolland PorterJames, Mark, MD  cyclobenzaprine (FLEXERIL) 10 MG tablet Take 1 tablet (10 mg total) by mouth 3 (three) times daily as needed for muscle spasms. Patient not taking: Reported on 03/31/2014 09/11/13   Carmelina DaneAnderson, Jeffery S, MD  fluticasone [facility](FLONASE) 50 MCG/ACT nasal spray Place 2 sprays into both nostrils daily. 06/19/13   Shade FloodGreene, Jeffrey R, MD  HYDROcodone-acetaminophen (NORCO/VICODIN) 5-325 MG per tablet Take 1 tablet by mouth every 4 (four)  hours as needed. 03/31/14   Rolland PorterJames, Mark, MD  metroNIDAZOLE (FLAGYL) 500 MG tablet Take 1 tablet (500 mg total) by mouth 2 (two) times daily. 03/31/14   Rolland PorterJames, Mark, MD  montelukast (SINGULAIR) 10 MG tablet Take 1 tablet (10 mg total) by mouth at bedtime. 07/06/16   Jacalyn LefevreHaviland, Gelene Recktenwald, MD  Multiple Vitamin (MULTIVITAMIN WITH MINERALS) TABS tablet Take 1 tablet by mouth daily.    [provider]  ondansetron (ZOFRAN ODT) 4 MG disintegrating tablet Take 1 tablet (4 mg total) by mouth every 8 (eight) hours as needed for nausea. 03/31/14   Rolland PorterJames, Mark, MD  oxyCODONE-acetaminophen (ROXICET) 5-325 MG per tablet Take 1 tablet by mouth every 4 (four) hours as needed for severe pain. Patient not taking: Reported on 03/31/2014 09/11/13   Carmelina DaneAnderson, Jeffery S, MD  predniSONE (STERAPRED UNI-PAK 21 TAB) 10 MG (21) TBPK tablet Take 1 tablet (10 mg total) by mouth daily. Take 6 tabs by mouth daily  for 2 days, then 5 tabs for 2 days, then 4 tabs for 2 days, then 3 tabs for 2 days, 2 tabs for 2 days, then 1 tab by mouth daily for 2 days 07/06/16   Jacalyn LefevreHaviland, Coral Timme, MD    Family History Family History  Problem Relation Age of Onset  . Stroke Mother     Social History  Social History  Substance Use Topics  . Smoking status: Never Smoker  . Smokeless tobacco: Never Used  . Alcohol use 0.0 oz/week     Allergies   Patient has no known allergies.   Review of Systems Review of Systems  Respiratory: Positive for shortness of breath and wheezing.   All other systems reviewed and are negative.    Physical Exam Updated Vital Signs BP (!) 161/92   Pulse (!) 55   Temp 98.6 F (37 C) (Oral)   Resp 18   SpO2 98%   Physical Exam  Constitutional: He is oriented to person, place, and time. He appears well-developed and well-nourished.  HENT:  Head: Normocephalic and atraumatic.  Right Ear: External ear normal.  Left Ear: External ear normal.  Nose: Nose normal.  Mouth/Throat: Oropharynx is clear and  moist.  Eyes: Conjunctivae and EOM are normal. Pupils are equal, round, and reactive to light.  Neck: Normal range of motion. Neck supple.  Cardiovascular: Normal rate, regular rhythm, normal heart sounds and intact distal pulses.   Pulmonary/Chest: He has wheezes.  Abdominal: Soft. Bowel sounds are normal.  Musculoskeletal: Normal range of motion.  Neurological: He is alert and oriented to person, place, and time.  Skin: Skin is warm.  Psychiatric: He has a normal mood and affect. His behavior is normal. Judgment and thought content normal.  Nursing note and vitals reviewed.    ED Treatments / Results  Labs (all labs ordered are listed, but only abnormal results are displayed) Labs Reviewed - No data to display  EKG  EKG Interpretation None       Radiology No results found.  Procedures Procedures (including critical care time)  Medications Ordered in ED Medications  albuterol (PROVENTIL HFA;VENTOLIN HFA) 108 (90 Base) MCG/ACT inhaler 1-2 puff (2 puffs Inhalation Given 07/06/16 1957)  albuterol (PROVENTIL) (2.5 MG/3ML) 0.083% nebulizer solution 5 mg (5 mg Nebulization Given 07/06/16 1720)  predniSONE (DELTASONE) tablet 60 mg (60 mg Oral Given 07/06/16 1957)  AEROCHAMBER PLUS FLO-VU MEDIUM MISC 1 each (1 each Other Given 07/06/16 2000)  albuterol (PROVENTIL) (2.5 MG/3ML) 0.083% nebulizer solution 2.5 mg (2.5 mg Nebulization Given 07/06/16 1957)     Initial Impression / Assessment and Plan / ED Course  I have reviewed the triage vital signs and the nursing notes.  Pertinent labs & imaging results that were available during my care of the patient were reviewed by me and considered in my medical decision making (see chart for details).    Pt feels much better.  He feels like he is able to breathe well enough for d/c.  He is given an inhaler with a spacer and instructed how to use it.  He knows to return if worse.  Final Clinical Impressions(s) / ED Diagnoses   Final  diagnoses:  Mild intermittent asthma with exacerbation    New Prescriptions New Prescriptions   MONTELUKAST (SINGULAIR) 10 MG TABLET    Take 1 tablet (10 mg total) by mouth at bedtime.     Jacalyn Lefevre, MD 07/06/16 2041

## 2016-07-06 NOTE — Discharge Instructions (Signed)
Avoid second hand smoke.  °

## 2016-07-06 NOTE — ED Triage Notes (Signed)
Pt reports he is here due to asthma exacerbation that stared last night and states he has ran out of his inhaler. Pt speaking in clear complete sentences. NAD.

## 2019-12-04 ENCOUNTER — Other Ambulatory Visit: Payer: Self-pay

## 2019-12-04 DIAGNOSIS — Z20822 Contact with and (suspected) exposure to covid-19: Secondary | ICD-10-CM

## 2019-12-05 ENCOUNTER — Ambulatory Visit: Payer: Self-pay | Admitting: *Deleted

## 2019-12-05 LAB — NOVEL CORONAVIRUS, NAA: SARS-CoV-2, NAA: NOT DETECTED

## 2019-12-05 LAB — SARS-COV-2, NAA 2 DAY TAT

## 2019-12-05 NOTE — Telephone Encounter (Signed)
Patient called for covid test results. Results still pending, patient verbalized understanding .

## 2020-04-14 ENCOUNTER — Encounter (HOSPITAL_COMMUNITY): Payer: Self-pay
